# Patient Record
Sex: Female | Born: 1955 | Race: White | Hispanic: No | Marital: Married | Smoking: Never smoker
Health system: Southern US, Community
[De-identification: ages and names within clinical notes are randomized; demographics above are authoritative.]

## PROBLEM LIST (undated history)

## (undated) DIAGNOSIS — I10 Essential (primary) hypertension: Secondary | ICD-10-CM

## (undated) DIAGNOSIS — I77810 Thoracic aortic ectasia: Secondary | ICD-10-CM

## (undated) DIAGNOSIS — C801 Malignant (primary) neoplasm, unspecified: Secondary | ICD-10-CM

## (undated) HISTORY — PX: CARDIAC CATHETERIZATION: SHX172

## (undated) HISTORY — DX: Essential (primary) hypertension: I10

## (undated) HISTORY — DX: Thoracic aortic ectasia: I77.810

---

## 2000-10-23 HISTORY — PX: BREAST EXCISIONAL BIOPSY: SUR124

## 2006-06-07 ENCOUNTER — Ambulatory Visit: Payer: Self-pay | Admitting: Cardiovascular Disease

## 2010-06-30 ENCOUNTER — Encounter (INDEPENDENT_AMBULATORY_CARE_PROVIDER_SITE_OTHER): Payer: Self-pay | Admitting: *Deleted

## 2010-11-22 NOTE — Letter (Signed)
Summary: Colonoscopy Letter  [facility]  [address] [place], [place] [address]   Phone: [phone]  Fax: [phone]      June 30, 2010 MRN: [id number]   [facility] [address] [place], [place]  [address]   Dear [name],   According to your medical record, it is time for you to schedule a Colonoscopy. The American Cancer Society recommends this procedure as a method to detect early colon cancer. Patients with a family history of colon cancer, or a personal history of colon polyps or inflammatory bowel disease are at increased risk.  This letter has beeen generated based on the recommendations made at the time of your procedure. If you feel that in your particular situation this may no longer apply, please contact our office.  Please call our office at [phone] to schedule this appointment or to update your records at your earliest convenience.  Thank you for cooperating with [place] to provide you with the very best care possible.   Sincerely,  Hedwig Morton. Juanda Chance, M.D.  [facility] Gastroenterology Division [phone]

## 2011-09-08 ENCOUNTER — Ambulatory Visit: Payer: Self-pay | Admitting: Family Medicine

## 2012-06-26 ENCOUNTER — Encounter: Payer: Self-pay | Admitting: Internal Medicine

## 2012-09-10 ENCOUNTER — Ambulatory Visit: Payer: Self-pay | Admitting: Family Medicine

## 2013-05-29 ENCOUNTER — Ambulatory Visit: Payer: Self-pay | Admitting: Family Medicine

## 2013-10-27 ENCOUNTER — Ambulatory Visit: Payer: Self-pay

## 2016-01-17 ENCOUNTER — Ambulatory Visit: Payer: Self-pay

## 2016-02-02 ENCOUNTER — Ambulatory Visit: Payer: Self-pay | Attending: Oncology | Admitting: *Deleted

## 2016-02-02 ENCOUNTER — Ambulatory Visit
Admission: RE | Admit: 2016-02-02 | Discharge: 2016-02-02 | Disposition: A | Discharge status: Home / Self Care | Payer: Self-pay | Source: Ambulatory Visit | Attending: Oncology | Admitting: Oncology

## 2016-02-02 ENCOUNTER — Encounter: Payer: Self-pay | Admitting: *Deleted

## 2016-02-02 VITALS — BP 152/86 | HR 106 | Temp 97.8°F | Resp 12 | Ht <= 58 in | Wt 263.0 lb

## 2016-02-02 DIAGNOSIS — Z0000 Encounter for general adult medical examination without abnormal findings: Secondary | ICD-10-CM

## 2016-02-02 NOTE — Progress Notes (Signed)
Subjective:     Patient ID: Julie Church, female   DOB: September 11, 1956, 60 y.o.   MRN: [id number]  HPI   Review of Systems     Objective:   Physical Exam  Pulmonary/Chest: Right breast exhibits no inverted nipple, no mass, no nipple discharge, no skin change and no tenderness. Left breast exhibits no mass, no nipple discharge, no skin change and no tenderness. Breasts are asymmetrical.    Right breast larger than the left breast  Abdominal: There is no splenomegaly or hepatomegaly.  Genitourinary: No labial fusion. There is no rash, tenderness, lesion or injury on the right labia. There is no rash, tenderness, lesion or injury on the left labia. Cervix exhibits friability. Cervix exhibits no discharge. No erythema or tenderness in the vagina. No signs of injury around the vagina. No vaginal discharge found.    Patient with supracervical hysterectomy.  States she does not have her ovaries.  Cervix bled slightly on exam.       Assessment:     60 year old White female presents to [facility] for clinical breast exam, possible pap and mammogram.  Clinical breast exam unremarkable.  Taught self breast awareness.  Pelvic exam reveals a supracervical hysterectomy.  Patient was unsure if she had her cervix.  Explained that she would need to follow pap smear screening quidelines.  Specimen collected for pap smear.  Patient has been screened for eligibility.  She does not have any insurance, Medicare or Medicaid.  She also meets financial eligibility.  Hand-out given on the Affordable Care Act.     Plan:     Screening mammogram ordered.  Specimen sent to the lab.  Will follow-up per BCCCP protocol.

## 2016-02-02 NOTE — Patient Instructions (Addendum)
Human Papillomavirus Human papillomavirus (HPV) is the most common sexually transmitted infection (STI) and is highly contagious. HPV infections cause genital warts and cancers to the outlet of the womb (cervix), birth canal (vagina), opening of the birth canal (vulva), and anus. There are over 100 types of HPV. Unless wartlike lesions are present in the throat or there are genital warts that you can see or feel, HPV usually does not cause symptoms. It is possible to be infected for long periods and pass it on to others without knowing it. CAUSES  HPV is spread from person to person through sexual contact. This includes oral, vaginal, or anal sex. RISK FACTORS  Having unprotected sex. HPV can be spread by oral, vaginal, or anal sex.  Having several sex partners.  Having a sex partner who has other sex partners.  Having or having had another sexually transmitted infection. SIGNS AND SYMPTOMS  Most people carrying HPV do not have any symptoms. If symptoms are present, symptoms may include:  Wartlike lesions in the throat (from having oral sex).  Warts in the infected skin or mucous membranes.  Genital warts that may itch, burn, or bleed.  Genital warts that may be painful or bleed during sexual intercourse. DIAGNOSIS  If wartlike lesions are present in the throat or genital warts are present, your health care provider can usually diagnose HPV by physical examination.   Genital warts are easily seen with the naked eye.  Currently, there is no FDA-approved test to detect HPV in males.  In females, a Pap test can show cells that are infected with HPV.  In females, a scope can be used to view the cervix (colposcopy). A colposcopy can be performed if the pelvic exam or Pap test is abnormal. A sample of tissue may be removed (biopsy) during the colposcopy. TREATMENT  There is no treatment for the virus itself. However, there are treatments for the health problems and symptoms HPV can cause.  Your health care provider will follow you closely after you are treated. This is because the HPV can come back and may need treatment again. Treatment of HPV may include:   Medicines, which may be injected or applied in a cream, lotion, or gel form.  Use of a probe to apply extreme cold (cryotherapy).  Application of an intense beam of light (laser treatment).  Use of a probe to apply extreme heat (electrocautery).  Surgery. HOME CARE INSTRUCTIONS   Take medicines only as directed by your health care provider.  Use over-the-counter creams for itching or irritation as directed by your health care provider.  Keep all follow-up visits as directed by your health care provider. This is important.  Do not touch or scratch the warts.  Do not treat genital warts with medicines used for treating hand warts.  Do not have sex while you are being treated.  Do not douche or use tampons during treatment of HPV.  Tell your sex partner about your infection because he or she may also need treatment.  If you become pregnant, tell your health care provider that you have had HPV. Your health care provider will monitor you closely during pregnancy to be sure your baby is safe.  After treatment, use condoms during sex to prevent future infections.  Have only one sex partner.  Have a sex partner who does not have other sex partners. PREVENTION   Talk to your health care provider about getting the HPV vaccines. These vaccines prevent some HPV infections and cancers.  It is recommended that the vaccine be given to males and females between the ages of 9 and 26 years old. It will not work if you already have HPV, and it is not recommended for pregnant women.  A Pap test is done to screen for cervical cancer in women.  The first Pap test should be done at age 21 years.  Between ages 21 and 29 years, Pap tests are repeated every 2 years.  Beginning at age 30, you are advised to have a Pap test every  3 years as long as your past 3 Pap tests have been normal.  Some women have medical problems that increase the chance of getting cervical cancer. Talk to your health care provider about these problems. It is especially important to talk to your health care provider if a new problem develops soon after your last Pap test. In these cases, your health care provider may recommend more frequent screening and Pap tests.  The above recommendations are the same for women who have or have not gotten the vaccine for HPV.  If you had a hysterectomy for a problem that was not a cancer or a condition that could lead to cancer, then you no longer need Pap tests. However, even if you no longer need a Pap test, a regular exam is a good idea to make sure no other problems are starting.   If you are between the ages of 65 and 70 years and you have had normal Pap tests going back 10 years, you no longer need Pap tests. However, even if you no longer need a Pap test, a regular exam is a good idea to make sure no other problems are starting.  If you have had past treatment for cervical cancer or a condition that could lead to cancer, you need Pap tests and screening for cancer for at least 20 years after your treatment.  If Pap tests have been discontinued, risk factors (such as a new sexual partner)need to be reassessed to determine if screening should be resumed.  Some women may need screenings more often if they are at high risk for cervical cancer. SEEK MEDICAL CARE IF:   The treated skin becomes red, swollen, or painful.  You have a fever.  You feel generally ill.  You feel lumps or pimple-like projections in and around your genital area.  You develop bleeding of the vagina or the treatment area.  You have painful sexual intercourse. MAKE SURE YOU:   Understand these instructions.  Will watch your condition.  Will get help if you are not doing well or get worse.   This information is not  intended to replace advice given to you by your health care provider. Make sure you discuss any questions you have with your health care provider.   Document Released: 12/30/2003 Document Revised: 10/30/2014 Document Reviewed: 01/14/2014 Elsevier Interactive Patient Education 2016 Elsevier Inc.   Gave patient hand-out, Women Staying Healthy, Active and Well from BCCCP, with education on breast health, pap smears, heart and colon health. 

## 2016-02-05 LAB — PAP LB AND HPV HIGH-RISK
HPV, HIGH-RISK: NEGATIVE
PAP Smear Comment: 0

## 2016-02-08 ENCOUNTER — Encounter: Payer: Self-pay | Admitting: *Deleted

## 2016-02-08 NOTE — Progress Notes (Signed)
Mailed letter to inform patient of her normal mammogram and pap results.  Next mammo in one year and next pap in 5 years.  [facility] to [place].

## 2017-03-26 ENCOUNTER — Ambulatory Visit
Admission: RE | Admit: 2017-03-26 | Discharge: 2017-03-26 | Disposition: A | Discharge status: Home / Self Care | Payer: Self-pay | Source: Ambulatory Visit | Attending: Oncology | Admitting: Oncology

## 2017-03-26 ENCOUNTER — Encounter: Payer: Self-pay | Admitting: *Deleted

## 2017-03-26 ENCOUNTER — Ambulatory Visit: Payer: Self-pay | Attending: Oncology | Admitting: *Deleted

## 2017-03-26 VITALS — BP 127/72 | HR 70 | Temp 98.2°F | Ht 65.0 in | Wt 232.0 lb

## 2017-03-26 DIAGNOSIS — Z0000 Encounter for general adult medical examination without abnormal findings: Secondary | ICD-10-CM

## 2017-03-26 HISTORY — DX: Malignant (primary) neoplasm, unspecified: C80.1

## 2017-03-26 NOTE — Progress Notes (Signed)
Subjective:     Patient ID: Julie Church, female   DOB: 1956-08-10, 61 y.o.   MRN: [id number]  HPI   Review of Systems     Objective:   Physical Exam  Pulmonary/Chest: Right breast exhibits no inverted nipple, no mass, no nipple discharge, no skin change and no tenderness. Left breast exhibits no inverted nipple, no mass, no nipple discharge, no skin change and no tenderness. Breasts are symmetrical.         Assessment:     61 year old White female returns to [facility] for annual screening.  Clinical breast exam unremarkable.  Taught self breast exam.  Patient with history of supracervical hysterectomy.  Last pap 2017 was negative / negative.  Informed that next pap will be due in 2022.  Patient has been screened for eligibility.  She does not have any insurance, Medicare or Medicaid.  She also meets financial eligibility.  Hand-out given on the Affordable Care Act.    Plan:     Screening mammogram ordered.  Will follow per BCCCP protocol.

## 2017-03-26 NOTE — Patient Instructions (Signed)
Gave patient hand-out, Women Staying Healthy, Active and Well from BCCCP, with education on breast health, pap smears, heart and colon health. 

## 2018-09-04 ENCOUNTER — Ambulatory Visit
Admission: RE | Admit: 2018-09-04 | Discharge: 2018-09-04 | Disposition: A | Discharge status: Home / Self Care | Payer: Self-pay | Source: Ambulatory Visit | Attending: Oncology | Admitting: Oncology

## 2018-09-04 ENCOUNTER — Ambulatory Visit: Payer: Self-pay | Attending: Oncology

## 2018-09-04 VITALS — BP 145/89 | HR 78 | Temp 97.3°F | Ht 66.0 in | Wt 217.0 lb

## 2018-09-04 DIAGNOSIS — Z0000 Encounter for general adult medical examination without abnormal findings: Secondary | ICD-10-CM

## 2018-09-04 NOTE — Progress Notes (Addendum)
  Subjective:     Patient ID: Julie Church, female   DOB: 28-Feb-1956, 62 y.o.   MRN: [id number]  HPI   Review of Systems     Objective:   Physical Exam  Pulmonary/Chest: Right breast exhibits no inverted nipple, no mass, no nipple discharge, no skin change and no tenderness. Left breast exhibits no inverted nipple, no mass, no nipple discharge, no skin change and no tenderness. Breasts are symmetrical.       Assessment:     62 year old patient returns for annual [facility] visit. Patient reports 61 lb  Intentional weight loss. Patient screened, and meets BCCCP eligibility.  Patient does not have insurance, Medicare or Medicaid.  Handout given on Affordable Care Act. Instructed patient on breast self awareness using teach back method.  Clinical breast exam unremarkable.  No mass or lump palpated.   Risk Assessment    Risk Scores      09/04/2018   Last edited by: Rico Junker, RN   5-year risk: 1.4 %   Lifetime risk: 6.5 %             Plan:     Sent for bilateral screening mammogram.

## 2018-09-10 NOTE — Progress Notes (Signed)
Letter mailed from [facility] to notify of normal mammogram results.  Patient to return in one year for annual screening.  Copy to [facility]. 

## 2019-11-06 ENCOUNTER — Ambulatory Visit: Payer: HRSA Program | Attending: Internal Medicine

## 2019-11-06 DIAGNOSIS — Z20822 Contact with and (suspected) exposure to covid-19: Secondary | ICD-10-CM | POA: Insufficient documentation

## 2019-11-07 LAB — NOVEL CORONAVIRUS, NAA: SARS-CoV-2, NAA: NOT DETECTED

## 2019-12-16 ENCOUNTER — Ambulatory Visit: Payer: Self-pay | Admitting: *Deleted

## 2019-12-16 NOTE — Telephone Encounter (Signed)
  Answer Assessment - Initial Assessment Questions 1. MAIN CONCERN OR SYMPTOM:  "What is your main concern right now?" "What question do you have?" "What's the main symptom you're worried about?" (e.g., fever, pain, redness, swelling)     Fever of 101.7 2. VACCINE: "What vaccination did you receive?" "Is this your first or second shot?" (e.g., none; [place], other)     End dose of Pfizer 3. SYMPTOM ONSET: "When did the fever begin?" (e.g., not relevant; hours, days)      Today prior to callng 4. SYMPTOM SEVERITY: "How bad is it?"     Temperature 101.7  5. FEVER: "Is there a fever?" If so, ask: "What is it, how was it measured, and when did it start?"      yes 6. PAST REACTIONS: "Have you reacted to immunizations before?" If so, ask: "What happened?"     Not assessed 7. OTHER SYMPTOMS: "Do you have any other symptoms?"     Chills beginning on yesterday and also today  Protocols used: CORONAVIRUS (COVID-19) VACCINE QUESTIONS AND REACTIONS-A-AH

## 2019-12-16 NOTE — Telephone Encounter (Signed)
Patient calling after receiving 2nd Covid-19 on yesterday. Pt states she has a fever of 101.7 currently. Pt states she has not taken any medication to treat the fever currently. Pt calling to see when she should become concerned regarding the fever. Explained to patient that a fever is a common reaction to the vaccine. Pt given home care advice to treat fever and advised to contact PCP if fever last longer than 48 hours or if she becomes worse. Pt verbalized understanding.     Reason for Disposition . COVID-19 vaccine, systemic reactions (e.g., fatigue, fever, muscle aches), questions about  Protocols used: CORONAVIRUS (COVID-19) VACCINE QUESTIONS AND REACTIONS-A-AH

## 2021-06-29 ENCOUNTER — Ambulatory Visit: Payer: Self-pay | Attending: Oncology | Admitting: *Deleted

## 2021-06-29 ENCOUNTER — Ambulatory Visit
Admission: RE | Admit: 2021-06-29 | Discharge: 2021-06-29 | Disposition: A | Discharge status: Home / Self Care | Payer: Self-pay | Source: Ambulatory Visit | Attending: Oncology | Admitting: Oncology

## 2021-06-29 ENCOUNTER — Other Ambulatory Visit: Payer: Self-pay

## 2021-06-29 VITALS — BP 146/87 | HR 64 | Temp 98.9°F | Ht 65.0 in | Wt 243.0 lb

## 2021-06-29 DIAGNOSIS — Z0000 Encounter for general adult medical examination without abnormal findings: Secondary | ICD-10-CM

## 2021-06-29 NOTE — Patient Instructions (Signed)
Gave patient hand-out, Women Staying Healthy, Active and Well from BCCCP, with education on breast health, pap smears, heart and colon health. 

## 2021-06-29 NOTE — Progress Notes (Signed)
Subjective:     Patient ID: Julie Church, female   DOB: 1956-04-01, 65 y.o.   MRN: [id number]  HPI  [facility] Medical History Record - 06/28/21 1440       Breast History   Screening cycle New    Provider (CBE) Dr. Audie Pinto  [facility]    Initial Mammogram 06/29/21    Last Mammogram Annual    Last Mammogram Date 09/04/18    Provider (Mammogram)  [facility]    Recent Breast Symptoms None      Breast Cancer History   Breast Cancer History No personal or family history      Previous History of Breast Problems   Breast Surgery or Biopsy Left   benign lumpectomy   Breast Implants N/A    BSE Done Monthly      Gynecological/Obstetrical History   LMP --   65 yo   Is there any chance that the client could be pregnant?  No    Age at menarche 69    Age at menopause 34    PAP smear history Annually    Date of last PAP  02/02/16    Provider (PAP) [facility] neg/neg    Age at first live birth 83    Breast fed children Yes (type length in comments)   2 months   Cervical, Uterine or Ovarian cancer No    Family history of Cervial, Uterine or Ovarian cancer No    Hysterectomy Yes    Cervix removed No    Ovaries removed Yes    Laser/Cryosurgery No    Current method of birth control None    Current method of Estrogen/Hormone replacement None    Smoking history None              Review of Systems     Objective:   Physical Exam Chest:  Breasts:    Breasts are asymmetrical.     Right: No swelling, bleeding, inverted nipple, mass, nipple discharge, skin change or tenderness.     Left: No swelling, bleeding, inverted nipple, mass, nipple discharge, skin change or tenderness.       Comments: Right breast larger than the left Abdominal:     Palpations: There is no hepatomegaly or splenomegaly.  Genitourinary:    Exam position: Lithotomy position.     Labia:        Right: No rash, tenderness, lesion or injury.        Left: No rash, tenderness, lesion or injury.      Urethra: No  prolapse or urethral pain.     Vagina: No signs of injury and foreign body. No vaginal discharge, erythema, tenderness, bleeding, lesions or prolapsed vaginal walls.     Adnexa:        Right: No mass.         Left: No mass.       Comments: Supracervical hysterectomy Lymphadenopathy:     Upper Body:     Right upper body: No supraclavicular or axillary adenopathy.     Left upper body: No supraclavicular or axillary adenopathy.      Assessment:     65 year old female returns to [facility] for annual screening.  Clinical breast exam unremarkable.  Taught self breast awareness.  Patient with supracervical hysterectomy.  Specimen collected for pap smear.  Patient has been screened for eligibility.  She does not have any insurance, Medicare or Medicaid.  She also meets financial eligibility.   Risk Assessment  Risk Scores       06/29/2021 09/04/2018   Last edited by: Theodore Demark, RN Rico Junker, RN   5-year risk: 1.5 % 1.4 %   Lifetime risk: 6 % 6.5 %               Plan:     Screening mammogram ordered.  Specimen for pap sent to the lab.  Will follow up per BCCCP protocol.

## 2021-07-01 LAB — IGP, APTIMA HPV: HPV Aptima: NEGATIVE

## 2021-07-14 NOTE — Progress Notes (Signed)
Phoned patient with [facility] 1 mammogram results, and negative/negative   pap results.  Next pap due in 5 years. Patient  Instructed to return for annual screening. Copy to [facility].

## 2022-01-10 DIAGNOSIS — E7800 Pure hypercholesterolemia, unspecified: Secondary | ICD-10-CM | POA: Insufficient documentation

## 2022-01-27 ENCOUNTER — Encounter: Payer: Self-pay | Admitting: Cardiovascular Disease

## 2022-01-27 ENCOUNTER — Ambulatory Visit: Payer: Medicare Other | Admitting: Cardiovascular Disease

## 2022-01-27 DIAGNOSIS — I10 Essential (primary) hypertension: Secondary | ICD-10-CM | POA: Diagnosis not present

## 2022-01-27 DIAGNOSIS — R9431 Abnormal electrocardiogram [ECG] [EKG]: Secondary | ICD-10-CM

## 2022-01-27 DIAGNOSIS — E782 Mixed hyperlipidemia: Secondary | ICD-10-CM

## 2022-01-27 DIAGNOSIS — Z87891 Personal history of nicotine dependence: Secondary | ICD-10-CM

## 2022-01-27 NOTE — Progress Notes (Signed)
Cardiology Office Note ? ?Date:  01/27/2022  ? ?ID:  Julie Church, DOB 04/20/1956, MRN [id number] ? ?PCP:  Gae Bon, NP  ? ?Chief Complaint  ?Patient presents with  ? New Patient (Initial Visit)  ?  Patient was told at some point she had a mild heart attack. Patient had a Cardiac Cath 20 years ago at [facility]. Medications reviewed by the patient verbally.   ? ? ?HPI:  ?Julie Church is a 66 year old woman with past medical history of ?Former smoker, 10- 20 years ?Who presents for abnormal EKG, cardiac risk factors ? ?Recently seen by primary care, ?Reports that she was told by primary care that her EKG was abnormal, had prior MI ?No EKG available in our record system for review ?EKG and lab work has been requested ? ?Reports that she has no history of diabetes ?Current non-smoker ?On cholesterol medication ? 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 ?Has been on Lipitor at least 1 year  ? ?EKG personally reviewed by myself on todays visit ?Normal sinus rhythm rate 73 bpm no significant ST or T wave changes ? ?Other past medical history reviewed ?Reports having echocardiogram 2 weeks ago through primary care, this has been requested ?-Reports having cardiac catheterization 20 years ago, results unavailable ? ? ?PMH:   has a past medical history of Cancer ([place]). ? ?PSH:    ?Past Surgical History:  ?Procedure Laterality Date  ? BREAST EXCISIONAL BIOPSY Left 2002  ? benign  ? CARDIAC CATHETERIZATION    ? [facility]  ? ? ?Current Outpatient Medications  ?Medication Sig Dispense Refill  ? amLODipine (NORVASC) 5 MG tablet Take 5 mg by mouth daily.    ? atorvastatin (LIPITOR) 20 MG tablet Take 20 mg by mouth daily.    ? Coenzyme Q10 10 MG capsule Take 10 mg by mouth daily.    ? Glucosamine-Chondroitin 500-400 MG CAPS Take by mouth daily.    ?  lisinopril-hydrochlorothiazide (ZESTORETIC) 20-25 MG tablet Take 1 tablet by mouth daily.    ? Omega-3 Fatty Acids (FISH OIL) 1000 MG CAPS Take by mouth daily.    ? ALPRAZolam (XANAX) 0.25 MG tablet Use 1 hour before dental appointment (Patient not taking: Reported on 01/27/2022)    ? ?No current facility-administered medications for this visit.  ? ? ?Allergies:   Patient has no known allergies.  ? ?Social History:  The patient  reports that she has never smoked. She has never used smokeless tobacco.  ? ?Family History:   family history includes Hyperlipidemia in her father and mother; Hypertension in her father and mother.  ? ?Review of Systems: ?Review of Systems  ?Constitutional: Negative.   ?HENT: Negative.    ?Respiratory: Negative.    ?Cardiovascular: Negative.   ?Gastrointestinal: Negative.   ?Musculoskeletal: Negative.   ?Neurological: Negative.   ?Psychiatric/Behavioral: Negative.    ?All other systems reviewed and are negative. ? ? ?PHYSICAL EXAM: ?VS:  BP (!) 144/80 (BP Location: Right Arm, Patient Position: Sitting, Cuff Size: Large)   Pulse 73   Ht '5\' 4"'$  (1.626 m)   Wt 116.2 kg   SpO2 98%   BMI 43.99 kg/m?  , BMI Body mass index is 43.99 kg/m?. ?GEN: Well nourished, well developed, in no acute distress ?HEENT: normal ?Neck: no JVD, carotid bruits, or masses ?Cardiac: RRR;  no murmurs, rubs, or gallops,no edema  ?Respiratory:  clear to auscultation bilaterally, normal work of breathing ?GI: soft, nontender, nondistended, + BS ?MS: no deformity or atrophy ?Skin: warm and dry, no rash ?Neuro:  Strength and sensation are intact ?Psych: euthymic mood, full affect ? ?Recent Labs: ?No results found for requested labs within last 8760 hours.  ? ? ?Lipid Panel ?No results found for: CHOL, HDL, LDLCALC, TRIG ?  ? ?Wt Readings from Last 3 Encounters:  ?01/27/22 116.2 kg  ?06/29/21 110.2 kg  ?09/04/18 98.4 kg  ?  ? ?ASSESSMENT AND PLAN: ? ?Problem List Items Addressed This Visit   ?None ?Visit Diagnoses   ? ?  Morbid obesity ([place])    -  Primary  ? Relevant Orders  ? EKG 12-Lead  ? Abnormal EKG      ? Former smoker      ? Mixed hyperlipidemia      ? Relevant Medications  ? lisinopril-hydrochlorothiazide (ZESTORETIC) 20-25 MG tablet  ? atorvastatin (LIPITOR) 20 MG tablet  ? amLODipine (NORVASC) 5 MG tablet  ? ?  ? ?Evaluation of cardiac risk factors ?EKG is normal on today's visit ?We have requested outside records including EKG, lab work, echocardiogram done several weeks ago through primary care ?-Reports she is asymptomatic ?No further testing indicated at this time ? ?Hyperlipidemia ?She reports that she has very mild carotid disease on outside ultrasound ?Recommend she stay on her Lipitor ?Records requested ? ?Essential hypertension ?Blood pressure reasonable on today's visit, no changes to her medications ?Recommend she try to take her lisinopril HCTZ in the morning given nocturia ? ?Abnormal EKG ?Today's EKG normal explained that sometimes lead placement can lead to abnormal findings ?Outside records requested ? ? ? Total encounter time more than 50 minutes ? Greater than 50% was spent in counseling and coordination of care with the patient ? ? ? ?Signed, ?Esmond Plants, M.D., Ph.D. ?[facility] [place], [place] ?[phone] ?

## 2022-01-27 NOTE — Patient Instructions (Addendum)
Medication Instructions:  No changes  If you need a refill on your cardiac medications before your next appointment, please call your pharmacy.   Lab work: No new labs needed  Testing/Procedures: No new testing needed  Follow-Up: At [facility], you and your health needs are our priority.  As part of our continuing mission to provide you with exceptional heart care, we have created designated Provider Care Teams.  These Care Teams include your primary Cardiologist (physician) and Advanced Practice Providers (APPs -  Physician Assistants and Nurse Practitioners) who all work together to provide you with the care you need, when you need it.  You will need a follow up appointment as needed  Providers on your designated Care Team:   Christopher Berge, NP Ryan Dunn, PA-C Cadence Furth, PA-C  COVID-19 Vaccine Information can be found at: [email or web address] For questions related to vaccine distribution or appointments, please email [email or web address] or call [phone].    

## 2022-02-08 ENCOUNTER — Ambulatory Visit: Payer: Self-pay | Admitting: Cardiovascular Disease

## 2022-10-18 ENCOUNTER — Ambulatory Visit: Payer: Self-pay | Admitting: Family Medicine

## 2023-01-25 ENCOUNTER — Encounter: Payer: Self-pay | Admitting: Adult Health

## 2023-07-11 IMAGING — MG MM DIGITAL SCREENING BILAT W/ TOMO AND CAD
6 of 10 series · 6 of 30 positions shown · non-contrast
Comparison: Previous exam(s).

CLINICAL DATA: Screening.

EXAM:
DIGITAL SCREENING BILATERAL MAMMOGRAM WITH TOMOSYNTHESIS AND CAD
TECHNIQUE: Bilateral screening digital craniocaudal and mediolateral oblique
mammograms were obtained. Bilateral screening digital breast
tomosynthesis was performed. The images were evaluated with
computer-aided detection.

[R MLO synth-2D]
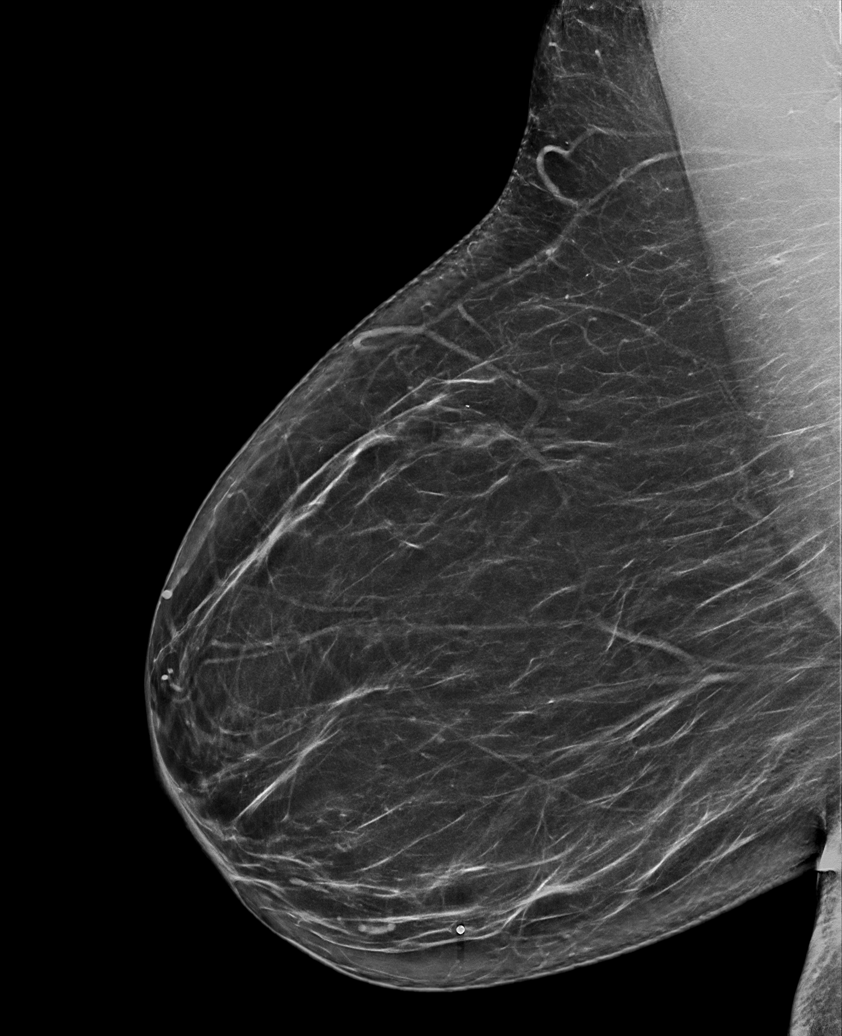

[L CC synth-2D (1 of 2)]
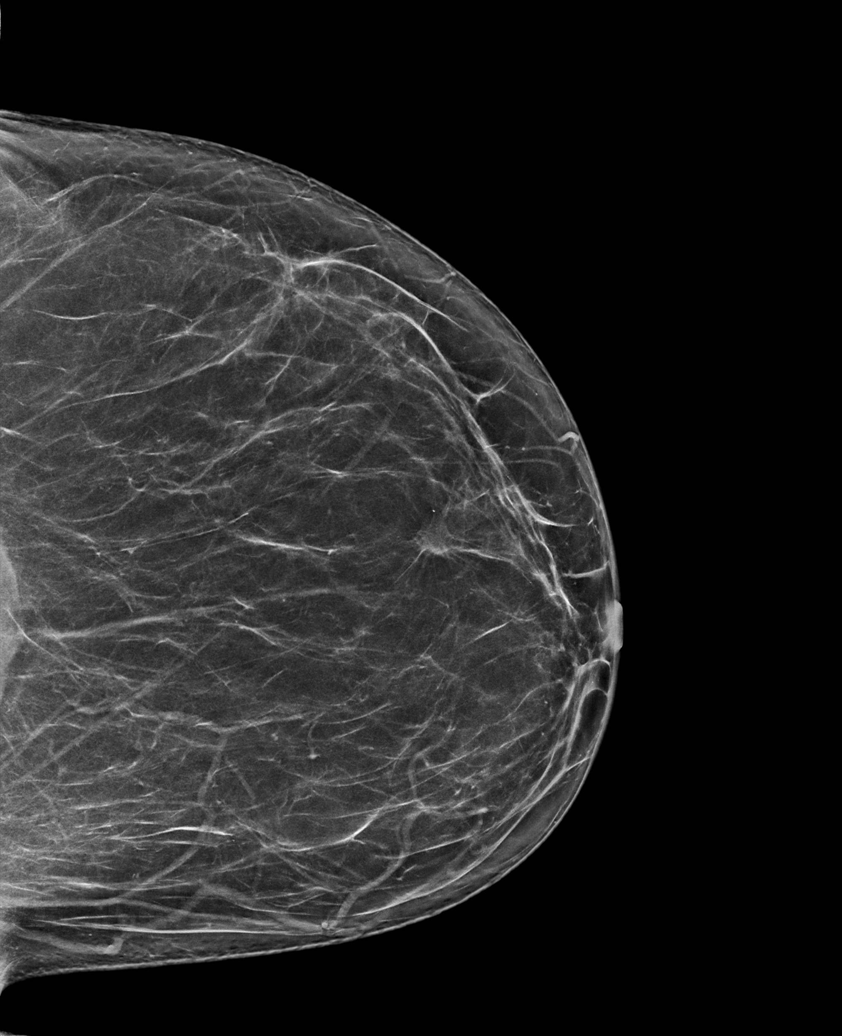

[R CC synth-2D]
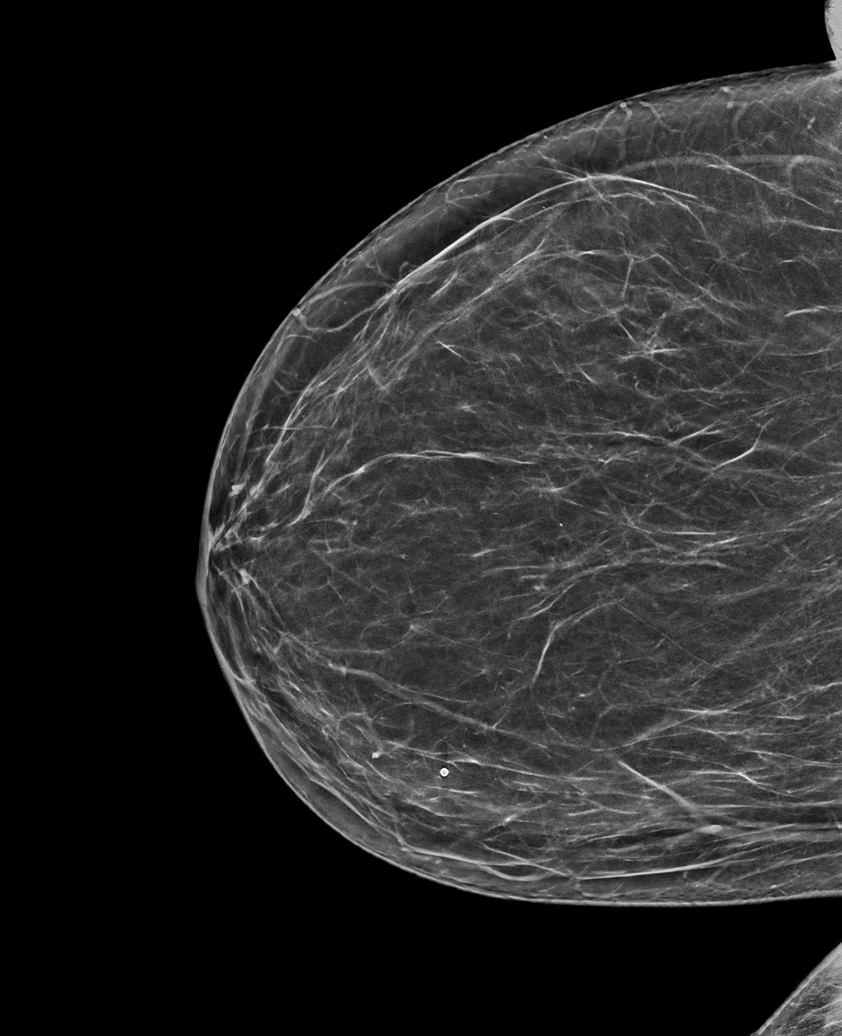

[L CC synth-2D (2 of 2)]
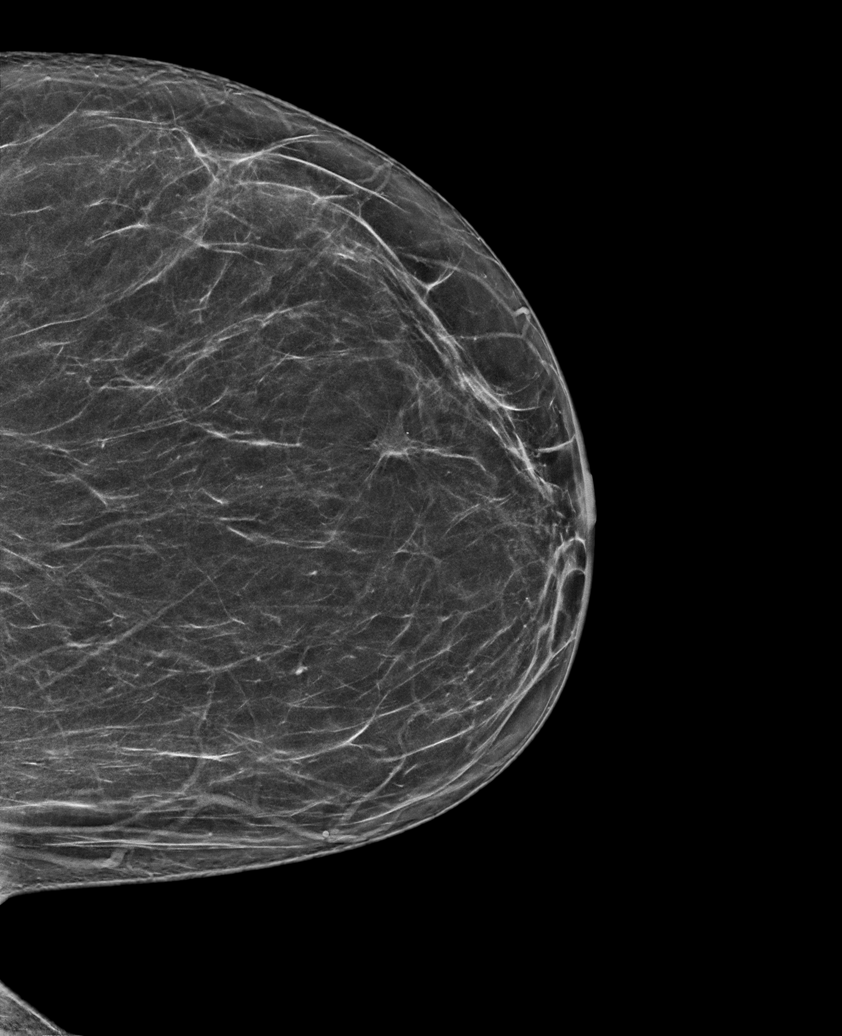

[L MLO synth-2D]
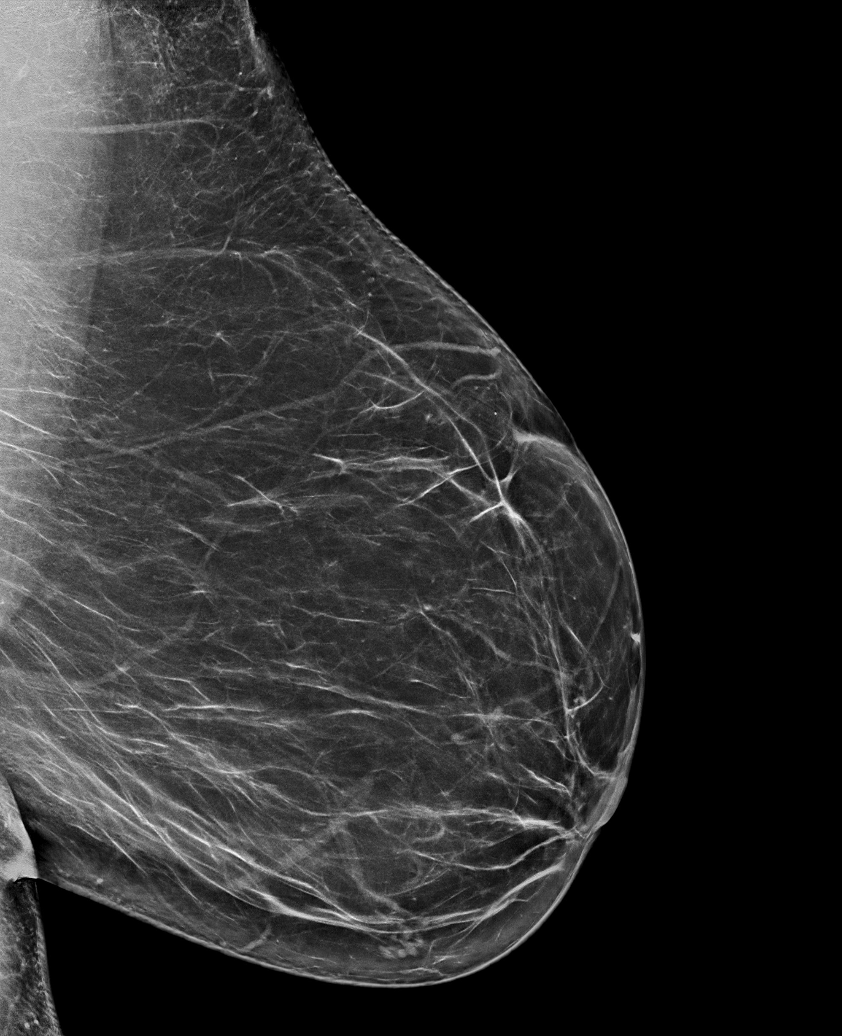

[R MLO tomo · tomo slice 41/80.0]
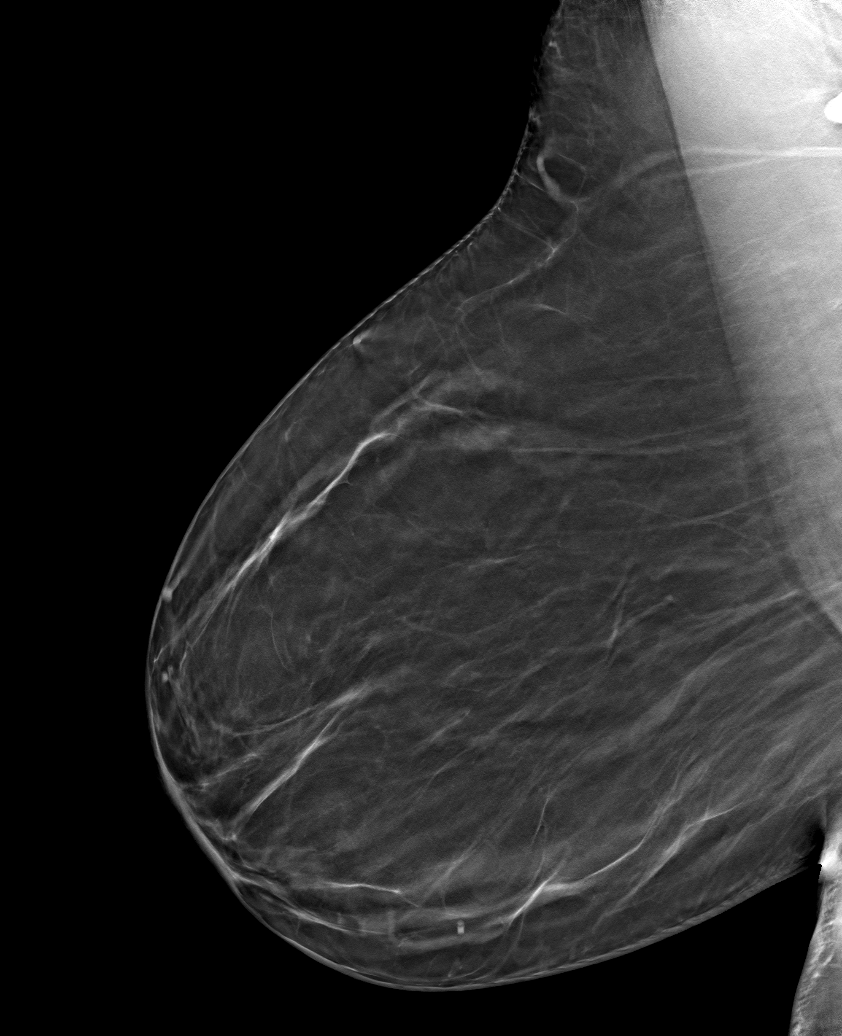

[6 of 30 positions shown; findings below may reference images not displayed]

ACR Breast Density Category b: There are scattered areas of
fibroglandular density.
FINDINGS: There are no findings suspicious for malignancy.
IMPRESSION: No mammographic evidence of malignancy. A result letter of this
screening mammogram will be mailed directly to the patient.

RECOMMENDATION:
Screening mammogram in one year. (Code:51-O-LD2)

BI-RADS CATEGORY  1: Negative.

## 2023-07-20 ENCOUNTER — Other Ambulatory Visit: Payer: Self-pay | Admitting: Adult Health

## 2023-07-20 DIAGNOSIS — Z1231 Encounter for screening mammogram for malignant neoplasm of breast: Secondary | ICD-10-CM

## 2023-08-01 ENCOUNTER — Ambulatory Visit
Admission: RE | Admit: 2023-08-01 | Discharge: 2023-08-01 | Disposition: A | Discharge status: Home / Self Care | Payer: Medicare HMO | Source: Ambulatory Visit | Attending: Adult Health | Admitting: Adult Health

## 2023-08-01 DIAGNOSIS — Z1231 Encounter for screening mammogram for malignant neoplasm of breast: Secondary | ICD-10-CM | POA: Insufficient documentation

## 2023-11-20 DIAGNOSIS — R7303 Prediabetes: Secondary | ICD-10-CM | POA: Insufficient documentation

## 2024-02-15 ENCOUNTER — Telehealth: Payer: Self-pay | Admitting: Cardiovascular Disease

## 2024-02-15 NOTE — Telephone Encounter (Signed)
 Called patient and left message for call back.

## 2024-02-15 NOTE — Telephone Encounter (Signed)
  Pt c/o swelling/edema: STAT if pt has developed SOB within 24 hours  If swelling, where is the swelling located? Both legs   How much weight have you gained and in what time span?   Have you gained 2 pounds in a day or 5 pounds in a week?   Do you have a log of your daily weights (if so, list)?   Are you currently taking a fluid pill? Yes   Are you currently SOB? No   Have you traveled recently in a car or plane for an extended period of time? No   Pt said, her pcp changed her medications and now her legs are swollen. She made an appt with Dr. Gollan, offered APP, however, she only wants to see Dr. Gollan

## 2024-02-15 NOTE — Telephone Encounter (Signed)
  Pt is returning call she said to call her at [phone]

## 2024-02-15 NOTE — Telephone Encounter (Signed)
 Called patient, advised that her PCP recently switched her BP medication over to Amlodipine 5 mg- she stated that she has had issues with the swelling since the start of the Amlodipine.   She only wanted to see Dr.Gollan requested to be placed on a wait list for cancellations.   Patient aware she should contact PCP since they placed her on the  medication, until she is able to get in with Dr.Gollan as it has been a few years since he seen her. Patient verbalized understanding and would call PCP as well.   Thank you!

## 2024-02-18 NOTE — Progress Notes (Unsigned)
 Cardiology Office Note    Date:  02/19/2024   ID:  Julie Church, DOB 01-13-1956, MRN [id number]  PCP:  Meri Stammer, NP  Cardiologist:  Belva Boyden, MD  Electrophysiologist:  None   Chief Complaint: Lower extremity swelling  History of Present Illness:   Julie Church is a 68 y.o. female with history of HTN, obesity, and depression who presents for evaluation of lower extremity swelling.  She was initially and last seen in the office on 01/27/2022 to establish care and for evaluation of abnormal EKG.  Echo performed through PCP's office in 01/2022 with EF reported at 61%, diastolic dysfunction, LVH, wall motion of the left ventricle, normal RV systolic function and ventricular cavity size, normal RVSP, no significant valvular abnormalities, and normal-sized aortic root.  She reported having undergone remote cardiac cath with results being unavailable for review.  EKG in our office showed sinus rhythm with no significant ST-T changes.  She was active at baseline without cardiac limitation.  No further cardiac testing was pursued at that time.  She contacted our office on 02/15/2024 reporting lower extremity swelling after amlodipine was titrated from 5 mg to 10 mg by outside office.  In this setting, appointment was made for today.  She comes in today reporting development of lower extremity swelling following the titration of amlodipine which was subsequently discontinued prior to yesterday.  In this setting she notes an improvement in lower extremity swelling at this time.  She indicates the swelling would be from the mid shin down and more progressive throughout the day.  She also notes some mild dyspnea when walking up from her chicken coop.  No frank chest pain.  No dizziness, presyncope, or syncope.  Her weight is down 16 pounds today when compared to her visit in 01/2022.  She indicates this is intentional and was a greater weight loss when she was doing weight watchers.   She does not monitor her sodium intake and at times will add salt to foods.  Not currently checking blood pressures at home.   Labs independently reviewed: 12/2023 - potassium 4.2, BUN 13, serum creatinine 0.6, albumin 4.7, AST/ALT normal, TC 162, TG 154, HDL 77, LDL 54, TSH normal 06/2023 - A1c 5.4 01/2018 - Hgb 14.4, PLT 368  Past Medical History:  Diagnosis Date   Cancer (HCC)    skin    Past Surgical History:  Procedure Laterality Date   BREAST EXCISIONAL BIOPSY Left 2002   benign   CARDIAC CATHETERIZATION     [facility]    Current Medications: Current Meds  Medication Sig   atorvastatin (LIPITOR) 20 MG tablet Take 20 mg by mouth daily.   Coenzyme Q10 10 MG capsule Take 10 mg by mouth daily.   Glucosamine-Chondroitin 500-400 MG CAPS Take by mouth daily.   lisinopril (ZESTRIL) 10 MG tablet Take 1 tablet by mouth daily.   lisinopril-hydrochlorothiazide (ZESTORETIC) 20-12.5 MG tablet Take 1 tablet by mouth daily.   Omega-3 Fatty Acids (FISH OIL) 1000 MG CAPS Take by mouth daily.    Allergies:   Patient has no known allergies.   Social History   Socioeconomic History   Marital status: Married    Spouse name: Not on file   Number of children: Not on file   Years of education: Not on file   Highest education level: Not on file  Occupational History   Not on file  Tobacco Use   Smoking status: Never   Smokeless tobacco: Never  Vaping Use   Vaping status: Never Used  Substance and Sexual Activity   Alcohol use: Not Currently   Drug use: Never   Sexual activity: Not on file  Other Topics Concern   Not on file  Social History Narrative   Not on file   Social Drivers of Health   Financial Resource Strain: Low Risk  (12/06/2023)   Received from [facility]   Overall Financial Resource Strain (CARDIA)    Difficulty of Paying Living Expenses: Not very hard  Food Insecurity: No Food Insecurity (12/06/2023)   Received from [facility]   Hunger Vital Sign     Worried About Running Out of Food in the Last Year: Never true    Ran Out of Food in the Last Year: Never true  Transportation Needs: No Transportation Needs (12/06/2023)   Received from [facility]   PRAPARE - Transportation    Lack of Transportation (Medical): No    Lack of Transportation (Non-Medical): No  Physical Activity: Not on file  Stress: Not on file  Social Connections: Unknown (04/29/2023)   Received from [facility]   Social Network    Social Network: Not on file     Family History:  The patient's family history includes Hyperlipidemia in her father and mother; Hypertension in her father and mother. There is no history of Breast cancer.  ROS:   12-point review of systems is negative unless otherwise noted in the HPI.   EKGs/Labs/Other Studies Reviewed:    Studies reviewed were summarized above. The additional studies were reviewed today: As above.  EKG:  EKG is ordered today.  The EKG ordered today demonstrates sinus bradycardia, 58 bpm, right axis deviation, no acute ST-T changes  Recent Labs: No results found for requested labs within last 365 days.  Recent Lipid Panel No results found for: "CHOL", "TRIG", "HDL", "CHOLHDL", "VLDL", "LDLCALC", "LDLDIRECT"  PHYSICAL EXAM:    VS:  BP 130/80 (BP Location: Left Arm, Patient Position: Sitting, Cuff Size: Large)   Pulse (!) 58   Ht 5\' 4"  (1.626 m)   Wt 240 lb 2 oz (108.9 kg)   SpO2 97%   BMI 41.22 kg/m   BMI: Body mass index is 41.22 kg/m.  Physical Exam Vitals reviewed.  Constitutional:      Appearance: She is well-developed.  HENT:     Head: Normocephalic and atraumatic.  Eyes:     General:        Right eye: No discharge.        Left eye: No discharge.  Cardiovascular:     Rate and Rhythm: Normal rate and regular rhythm.     Pulses:          Dorsalis pedis pulses are 2+ on the right side and 2+ on the left side.       Posterior tibial pulses are 2+ on the right side and 2+ on the left side.      Heart sounds: Normal heart sounds, S1 normal and S2 normal. Heart sounds not distant. No midsystolic click and no opening snap. No murmur heard.    No friction rub.  Pulmonary:     Effort: Pulmonary effort is normal. No respiratory distress.     Breath sounds: Normal breath sounds. No decreased breath sounds, wheezing, rhonchi or rales.  Chest:     Chest wall: No tenderness.  Musculoskeletal:     Cervical back: Normal range of motion.     Right lower leg: No  edema.     Left lower leg: No edema.  Skin:    General: Skin is warm and dry.     Nails: There is no clubbing.  Neurological:     Mental Status: She is alert and oriented to person, place, and time.  Psychiatric:        Speech: Speech normal.        Behavior: Behavior normal.        Thought Content: Thought content normal.        Judgment: Judgment normal.     Wt Readings from Last 3 Encounters:  02/19/24 240 lb 2 oz (108.9 kg)  01/27/22 256 lb 4 oz (116.2 kg)  06/29/21 243 lb (110.2 kg)     ASSESSMENT & PLAN:   Lower extremity swelling: Likely exacerbated and in the setting of amlodipine use.  Swelling was more progressive throughout the day indicative of dependent edema/venous insufficiency and less likely heart failure.  However, given some dyspnea we will obtain an echo to evaluate for new cardiomyopathy/structural changes.  Her weight is down 16 pounds today when compared to her initial/last visit in our office in 01/2022.  Further recommendations pending echo findings and trend off calcium channel blocker.  Dyspnea: Obtain echo.  If reassuring, and symptoms persist could consider noninvasive ischemic testing for further risk stratification.  HTN: Blood pressure is reasonably controlled in the office today.  Defer further titration of antihypertensive therapy given recent change from amlodipine and to titrated dose of lisinopril for the past 24 hours.  Currently on lisinopril 30 mg and HCTZ 12.5 mg daily.  Would avoid  calcium channel blocker usage moving forward.  Low-sodium diet is recommended.  HLD: LDL 54 in 12/2023.  She remains on atorvastatin 20 mg.  Followed by PCP.   Disposition: F/u with Dr. Gollan or an APP in 2 months.   Medication Adjustments/Labs and Tests Ordered: Current medicines are reviewed at length with the patient today.  Concerns regarding medicines are outlined above. Medication changes, Labs and Tests ordered today are summarized above and listed in the Patient Instructions accessible in Encounters.   Signed, Varney Gentleman, PA-C 02/19/2024 12:18 PM     [facility] - [place] [address] [place], [place] [address] [phone]

## 2024-02-19 ENCOUNTER — Ambulatory Visit: Attending: Physician Assistant | Admitting: Physician Assistant

## 2024-02-19 ENCOUNTER — Encounter: Payer: Self-pay | Admitting: Physician Assistant

## 2024-02-19 VITALS — BP 130/80 | HR 58 | Ht 64.0 in | Wt 240.1 lb

## 2024-02-19 DIAGNOSIS — R0602 Shortness of breath: Secondary | ICD-10-CM | POA: Diagnosis not present

## 2024-02-19 DIAGNOSIS — E782 Mixed hyperlipidemia: Secondary | ICD-10-CM

## 2024-02-19 DIAGNOSIS — I10 Essential (primary) hypertension: Secondary | ICD-10-CM

## 2024-02-19 DIAGNOSIS — M7989 Other specified soft tissue disorders: Secondary | ICD-10-CM

## 2024-02-19 NOTE — Patient Instructions (Signed)
 Medication Instructions:  Your Physician recommend you continue on your current medication as directed.    *If you need a refill on your cardiac medications before your next appointment, please call your pharmacy*  Lab Work: No labs ordered today  If you have labs (blood work) drawn today and your tests are completely normal, you will receive your results only by: MyChart Message (if you have MyChart) OR A paper copy in the mail If you have any lab test that is abnormal or we need to change your treatment, we will call you to review the results.  Testing/Procedures: Echo  Your physician has requested that you have an echocardiogram. Echocardiography is a painless test that uses sound waves to create images of your heart. It provides your doctor with information about the size and shape of your heart and how well your heart's chambers and valves are working.   You may receive an ultrasound enhancing agent through an IV if needed to better visualize your heart during the echo. This procedure takes approximately one hour.  There are no restrictions for this procedure.  This will take place at [address] [facility]) [address], [place] [address]  Please note: We ask at that you not bring children with you during ultrasound (echo/ vascular) testing. Due to room size and safety concerns, children are not allowed in the ultrasound rooms during exams. Our front office staff cannot provide observation of children in our lobby area while testing is being conducted. An adult accompanying a patient to their appointment will only be allowed in the ultrasound room at the discretion of the ultrasound technician under special circumstances. We apologize for any inconvenience.   Follow-Up: At [facility], you and your health needs are our priority.  As part of our continuing mission to provide you with exceptional heart care, our providers are all part of one team.  This team  includes your primary Cardiologist (physician) and Advanced Practice Providers or APPs (Physician Assistants and Nurse Practitioners) who all work together to provide you with the care you need, when you need it.  Your next appointment:   2 month(s)  Provider:   Timothy Gollan, MD or Varney Gentleman, PA-C

## 2024-04-03 ENCOUNTER — Ambulatory Visit: Attending: Physician Assistant

## 2024-04-03 DIAGNOSIS — R0602 Shortness of breath: Secondary | ICD-10-CM | POA: Diagnosis not present

## 2024-04-03 LAB — ECHOCARDIOGRAM COMPLETE
AR max vel: 4.01 cm2
AV Area VTI: 4.07 cm2
AV Area mean vel: 3.77 cm2
AV Mean grad: 4 mmHg
AV Peak grad: 7 mmHg
Ao pk vel: 1.32 m/s
Area-P 1/2: 3.77 cm2
S' Lateral: 2.93 cm

## 2024-04-04 ENCOUNTER — Other Ambulatory Visit: Payer: Self-pay

## 2024-04-04 ENCOUNTER — Ambulatory Visit: Payer: Self-pay | Admitting: Physician Assistant

## 2024-04-04 DIAGNOSIS — R0602 Shortness of breath: Secondary | ICD-10-CM

## 2024-04-20 NOTE — Progress Notes (Unsigned)
 Cardiology Office Note    Date:  04/23/2024   ID:  Julie Church, DOB 11-16-1955, MRN [id number]  PCP:  Sharl Delon NOVAK, NP  Cardiologist:  Evalene Lunger, MD  Electrophysiologist:  None   Chief Complaint: Follow-up  History of Present Illness:   Julie Church is a 67 y.o. female with history of HTN, dilated ascending thoracic aorta, obesity, and depression who presents for follow-up of echo.  She was initially seen in the office on 01/27/2022 to establish care and for evaluation of abnormal EKG.  Echo performed through PCP's office in 01/2022 with EF reported at 61%, diastolic dysfunction, LVH, normal RV systolic function and ventricular cavity size, normal RVSP, no significant valvular abnormalities, and normal-sized aortic root.  She reported having undergone remote cardiac cath with results being unavailable for review.  EKG in our office showed sinus rhythm with no significant ST-T changes.  She was active at baseline without cardiac limitation.  No further cardiac testing was pursued at that time.  She contacted our office on 02/15/2024 reporting lower extremity swelling after amlodipine was titrated from 5 mg to 10 mg by outside office.  She was subsequently seen in 01/2024 reporting lower extremity swelling following titration of amlodipine which was subsequently discontinued prior to appointment with improvement in lower extremity swelling.  She also noted some mild dyspnea when walking up from her chicken coop and intentional weight loss through lifestyle modification.  Echo in 03/2024 showed an EF of 60 to 65%, no regional wall motion abnormalities, normal LV diastolic function parameters, normal RV systolic function and ventricular cavity size, no significant valvular abnormalities, mild dilatation of the ascending aorta measuring 39 mm, and an estimated right atrial pressure of 3 mmHg.  She comes in doing well from a cardiac perspective.  She does not note any further shortness  of breath.  She does note some intermittent randomly occurring episodes of mild chest discomfort that will typically last a few seconds and spontaneously resolve.  She also notes her blood pressure is typically in the 140s to 150s systolic following discontinuation of amlodipine.  No further lower extremity swelling.  No dizziness, presyncope, or syncope.  She reports a family history in her cousin and aunt of brain aneurysm and is scheduled for CTA of the head later this month for this.   Labs independently reviewed: 12/2023 - potassium 4.2, BUN 13, serum creatinine 0.6, albumin 4.7, AST/ALT normal, TC 162, TG 154, HDL 77, LDL 54, TSH normal 06/2023 - A1c 5.4 01/2018 - Hgb 14.4, PLT 368  Past Medical History:  Diagnosis Date   Cancer (HCC)    skin   Dilatation of thoracic aorta (HCC)    Essential hypertension     Past Surgical History:  Procedure Laterality Date   BREAST EXCISIONAL BIOPSY Left 2002   benign   CARDIAC CATHETERIZATION     [facility]    Current Medications: Current Meds  Medication Sig   ALPRAZolam (XANAX) 0.25 MG tablet Use 1 hour before dental appointment   atorvastatin (LIPITOR) 20 MG tablet Take 20 mg by mouth daily.   Coenzyme Q10 10 MG capsule Take 10 mg by mouth daily.   CRANBERRY PO Take by mouth daily.   Glucosamine-Chondroitin 500-400 MG CAPS Take by mouth daily.   lisinopril-hydrochlorothiazide (ZESTORETIC) 20-12.5 MG tablet Take 1 tablet by mouth daily.   Multiple Vitamin (MULTIVITAMIN ADULT PO) daily.   Omega-3 Fatty Acids (FISH OIL) 1000 MG CAPS Take by mouth daily.   [DISCONTINUED]  lisinopril (ZESTRIL) 10 MG tablet Take 1 tablet by mouth daily.    Allergies:   Patient has no known allergies.   Social History   Socioeconomic History   Marital status: Married    Spouse name: Not on file   Number of children: Not on file   Years of education: Not on file   Highest education level: Not on file  Occupational History   Not on file  Tobacco Use    Smoking status: Never   Smokeless tobacco: Never  Vaping Use   Vaping status: Never Used  Substance and Sexual Activity   Alcohol use: Not Currently   Drug use: Never   Sexual activity: Not on file  Other Topics Concern   Not on file  Social History Narrative   Not on file   Social Drivers of Health   Financial Resource Strain: Low Risk  (12/06/2023)   Received from [facility]   Overall Financial Resource Strain (CARDIA)    Difficulty of Paying Living Expenses: Not very hard  Food Insecurity: No Food Insecurity (12/06/2023)   Received from [facility]   Hunger Vital Sign    Within the past 12 months, you worried that your food would run out before you got the money to buy more.: Never true    Within the past 12 months, the food you bought just didn't last and you didn't have money to get more.: Never true  Transportation Needs: No Transportation Needs (12/06/2023)   Received from [facility]   PRAPARE - Transportation    Lack of Transportation (Medical): No    Lack of Transportation (Non-Medical): No  Physical Activity: Not on file  Stress: Not on file  Social Connections: Unknown (04/29/2023)   Received from [facility]   Social Network    Social Network: Not on file     Family History:  The patient's family history includes Hyperlipidemia in her father and mother; Hypertension in her father and mother. There is no history of Breast cancer.  ROS:   12-point review of systems is negative unless otherwise noted in the HPI.   EKGs/Labs/Other Studies Reviewed:    Studies reviewed were summarized above. The additional studies were reviewed today:  2D echo 04/03/2024: 1. Left ventricular ejection fraction, by estimation, is 60 to 65%. The  left ventricle has normal function. The left ventricle has no regional  wall motion abnormalities. Left ventricular diastolic parameters were  normal.   2. Right ventricular systolic function is normal. The right ventricular   size is normal.   3. The mitral valve is normal in structure. No evidence of mitral valve  regurgitation.   4. The aortic valve is grossly normal. Aortic valve regurgitation is not  visualized.   5. Aortic dilatation noted. There is mild dilatation of the ascending  aorta, measuring 39 mm.   6. The inferior vena cava is normal in size with greater than 50%  respiratory variability, suggesting right atrial pressure of 3 mmHg.    EKG:  EKG is not ordered today.    Recent Labs: No results found for requested labs within last 365 days.  Recent Lipid Panel No results found for: CHOL, TRIG, HDL, CHOLHDL, VLDL, LDLCALC, LDLDIRECT  PHYSICAL EXAM:    VS:  BP (!) 148/90 (BP Location: Left Arm, Patient Position: Sitting, Cuff Size: Large)   Pulse 72   Ht 5' 4 (1.626 m)   Wt 235 lb (106.6 kg)   SpO2 98%  BMI 40.34 kg/m   BMI: Body mass index is 40.34 kg/m.  Physical Exam Vitals reviewed.  Constitutional:      Appearance: She is well-developed.  HENT:     Head: Normocephalic and atraumatic.  Eyes:     General:        Right eye: No discharge.        Left eye: No discharge.  Cardiovascular:     Rate and Rhythm: Normal rate and regular rhythm.     Pulses:          Posterior tibial pulses are 2+ on the right side and 2+ on the left side.     Heart sounds: Normal heart sounds, S1 normal and S2 normal. Heart sounds not distant. No midsystolic click and no opening snap. No murmur heard.    No friction rub.  Pulmonary:     Effort: Pulmonary effort is normal. No respiratory distress.     Breath sounds: Normal breath sounds. No decreased breath sounds, wheezing, rhonchi or rales.  Chest:     Chest wall: No tenderness.  Musculoskeletal:     Cervical back: Normal range of motion.     Right lower leg: No edema.     Left lower leg: No edema.  Skin:    General: Skin is warm and dry.     Nails: There is no clubbing.  Neurological:     Mental Status: She is alert and  oriented to person, place, and time.  Psychiatric:        Speech: Speech normal.        Behavior: Behavior normal.        Thought Content: Thought content normal.        Judgment: Judgment normal.     Wt Readings from Last 3 Encounters:  04/23/24 235 lb (106.6 kg)  02/19/24 240 lb 2 oz (108.9 kg)  01/27/22 256 lb 4 oz (116.2 kg)     ASSESSMENT & PLAN:   Precordial pain with exertional dyspnea: Echo without significant structural abnormality or cardiomyopathy.  Overall, symptoms of chest discomfort are atypical.  Schedule Lexiscan MPI to evaluate for high risk ischemia.  Dilated ascending thoracic aorta: Echo in 03/2024 with mildly dilated ascending thoracic aorta measuring 39 mm.  Further evaluate ascending thoracic aorta diameter on CT attenuation corrected imaging of stress test.  Pending findings, may need to pursue CTA chest/aorta given family history of brain aneurysms.  HTN: Blood pressure has been running high in the 140s to 150s systolic following discontinuation of amlodipine.  Titrate lisinopril to 40 mg daily with continuation of HCTZ 12.5 mg daily.  In follow-up, if blood pressure remains elevated consider addition of carvedilol.  HLD: LDL 54 in 12/2023.  She remains on atorvastatin 20 mg.     Disposition: F/u with Dr. Gollan or an APP in 2 months.   Medication Adjustments/Labs and Tests Ordered: Current medicines are reviewed at length with the patient today.  Concerns regarding medicines are outlined above. Medication changes, Labs and Tests ordered today are summarized above and listed in the Patient Instructions accessible in Encounters.   Signed, Bernardino Bring, PA-C 04/23/2024 12:45 PM     [facility] - [place] [employer] [address] [place], [place] [address] [phone]

## 2024-04-22 ENCOUNTER — Ambulatory Visit: Admitting: Cardiovascular Disease

## 2024-04-23 ENCOUNTER — Encounter: Payer: Self-pay | Admitting: Physician Assistant

## 2024-04-23 ENCOUNTER — Ambulatory Visit: Attending: Physician Assistant | Admitting: Physician Assistant

## 2024-04-23 VITALS — BP 148/90 | HR 72 | Ht 64.0 in | Wt 235.0 lb

## 2024-04-23 DIAGNOSIS — R072 Precordial pain: Secondary | ICD-10-CM | POA: Diagnosis not present

## 2024-04-23 DIAGNOSIS — E782 Mixed hyperlipidemia: Secondary | ICD-10-CM

## 2024-04-23 DIAGNOSIS — I10 Essential (primary) hypertension: Secondary | ICD-10-CM | POA: Diagnosis not present

## 2024-04-23 DIAGNOSIS — I77810 Thoracic aortic ectasia: Secondary | ICD-10-CM | POA: Diagnosis not present

## 2024-04-23 MED ORDER — LISINOPRIL 20 MG PO TABS: 20.0000 mg | ORAL_TABLET | Freq: Every day | ORAL | 3 refills | Status: DC

## 2024-04-23 NOTE — Patient Instructions (Signed)
 Medication Instructions:  Your physician recommends the following medication changes.  INCREASE: Lisinopril 20 mg daily  *If you need a refill on your cardiac medications before your next appointment, please call your pharmacy*  Lab Work: None ordered at this time  If you have labs (blood work) drawn today and your tests are completely normal, you will receive your results only by: MyChart Message (if you have MyChart) OR A paper copy in the mail If you have any lab test that is abnormal or we need to change your treatment, we will call you to review the results.  Testing/Procedures: Your provider has ordered a Lexiscan/ Exercise Myoview Stress test. This will take place at [facility]. Please report to the [facility] medical mall entrance. The volunteers at the first desk will direct you where to go.  [facility] MYOVIEW  Your provider has ordered a Stress Test with nuclear imaging. The purpose of this test is to evaluate the blood supply to your heart muscle. This procedure is referred to as a Non-Invasive Stress Test. This is because other than having an IV started in your vein, nothing is inserted or invades your body. Cardiac stress tests are done to find areas of poor blood flow to the heart by determining the extent of coronary artery disease (CAD). Some patients exercise on a treadmill, which naturally increases the blood flow to your heart, while others who are unable to walk on a treadmill due to physical limitations will have a pharmacologic/chemical stress agent called Lexiscan . This medicine will mimic walking on a treadmill by temporarily increasing your coronary blood flow.   Please note: these test may take anywhere between 2-4 hours to complete  How to prepare for your Myoview test:  Nothing to eat for 6 hours prior to the test No caffeine for 24 hours prior to test No smoking 24 hours prior to test. Your medication may be taken with water.  If your doctor stopped a medication because of  this test, do not take that medication. Ladies, please do not wear dresses.  Skirts or pants are appropriate. Please wear a short sleeve shirt. No perfume, cologne or lotion. Wear comfortable walking shoes. No heels!   PLEASE NOTIFY THE OFFICE AT LEAST 24 HOURS IN ADVANCE IF YOU ARE UNABLE TO KEEP YOUR APPOINTMENT.  [phone] AND  PLEASE NOTIFY NUCLEAR MEDICINE AT [facility] AT LEAST 24 HOURS IN ADVANCE IF YOU ARE UNABLE TO KEEP YOUR APPOINTMENT. [phone]   Follow-Up: At [facility], you and your health needs are our priority.  As part of our continuing mission to provide you with exceptional heart care, our providers are all part of one team.  This team includes your primary Cardiologist (physician) and Advanced Practice Providers or APPs (Physician Assistants and Nurse Practitioners) who all work together to provide you with the care you need, when you need it.  Your next appointment:   2 month(s)  Provider:   You may see Timothy Gollan, MD or Bernardino Bring, PA-C  We recommend signing up for the patient portal called MyChart.  Sign up information is provided on this After Visit Summary.  MyChart is used to connect with patients for Virtual Visits (Telemedicine).  Patients are able to view lab/test results, encounter notes, upcoming appointments, etc.  Non-urgent messages can be sent to your provider as well.   To learn more about what you can do with MyChart, go to [email or web address].

## 2024-05-12 ENCOUNTER — Encounter
Admission: RE | Admit: 2024-05-12 | Discharge: 2024-05-12 | Disposition: A | Discharge status: Home / Self Care | Source: Ambulatory Visit | Attending: Physician Assistant | Admitting: Physician Assistant

## 2024-05-12 ENCOUNTER — Other Ambulatory Visit: Payer: Self-pay | Admitting: Physician Assistant

## 2024-05-12 DIAGNOSIS — R072 Precordial pain: Secondary | ICD-10-CM | POA: Diagnosis not present

## 2024-05-12 MED ORDER — REGADENOSON 0.4 MG/5ML IV SOLN
0.4000 mg | Freq: Once | INTRAVENOUS | Status: AC
  Administered 2024-05-12: 0.4 mg via INTRAVENOUS

## 2024-05-12 MED ORDER — TECHNETIUM TC 99M TETROFOSMIN IV KIT
29.5800 | PACK | Freq: Once | INTRAVENOUS | Status: AC | PRN
  Administered 2024-05-12: 29.58 via INTRAVENOUS

## 2024-05-12 MED ORDER — TECHNETIUM TC 99M TETROFOSMIN IV KIT
10.7400 | PACK | Freq: Once | INTRAVENOUS | Status: AC | PRN
  Administered 2024-05-12: 10.74 via INTRAVENOUS

## 2024-05-12 NOTE — Progress Notes (Signed)
     Dickey PARAS Ladnier presented for a nuclear stress test today.  I Lesley LITTIE Maffucci, PA-C, provided direct supervision and was present during the stress portion of the study today, which was completed without significant symptoms, immediate complications, or acute ST/T changes on ECG.  Stress imaging is pending at this time.  Preliminary ECG findings may be listed in the chart, but the stress test result will not be finalized until perfusion imaging is complete.  Lesley LITTIE Maffucci, PA-C  05/12/2024, 11:13 AM

## 2024-05-13 ENCOUNTER — Ambulatory Visit: Payer: Self-pay | Admitting: Physician Assistant

## 2024-05-13 LAB — NM MYOCAR MULTI W/SPECT W/WALL MOTION / EF
LV dias vol: 82 mL (ref 46–106)
LV sys vol: 15 mL (ref 3.8–5.2)
MPHR: 153 {beats}/min
Nuc Stress EF: 82 %
Peak HR: 96 {beats}/min
Percent HR: 62 %
Rest HR: 63 {beats}/min
Rest Nuclear Isotope Dose: 10.7 mCi
SDS: 0
SRS: 0
SSS: 0
ST Depression (mm): 0 mm
Stress Nuclear Isotope Dose: 29.6 mCi
TID: 0.92

## 2024-06-29 NOTE — Progress Notes (Unsigned)
 Cardiology Office Note  Date:  06/30/2024   ID:  Devine Dant Spieker, DOB 09-16-1956, MRN [id number]  PCP:  Felicie Powell Chol, FNP   Chief Complaint  Patient presents with   Follow up Myoview      Doing well.     HPI:  [name] is a 68 year old woman with past medical history of Former smoker, 10- 20 years Who presents for abnormal EKG, cardiac risk factors, hypertension  LOV with myself 4/23 Seen by one of our providers July 2025  Previously taking amlodipine  5, doing well for several years, on 10 mg started developing leg swelling and amlodipine  was held with improvement of her swelling  For blood pressure lisinopril  dosing increased, additional 40 in the evening added with a lisinopril  HCTZ 20/12.5 in the morning  On today's visit blood pressure running 160/90  Active at baseline, regular exercise  Chronic Knee pain, was told she had bone-on-bone  Echo June 2025 EF 60%, normal RV size and function Aorta 3.9 cm  Myoview  July 2025 low risk study, likely normal  Labs reviewed Total cholesterol 162 LDL 54  Cardiac risk factors no history of diabetes Current non-smoker On cholesterol medication  Prior EKGs reviewed  Other past medical history reviewed Reports having echocardiogram 2 weeks ago through primary care, this has been requested -Reports having cardiac catheterization 20 years ago, results unavailable   PMH:   has a past medical history of Cancer [facility]), Dilatation of thoracic aorta (HCC), and Essential hypertension.  PSH:    Past Surgical History:  Procedure Laterality Date   BREAST EXCISIONAL BIOPSY Left 2002   benign   CARDIAC CATHETERIZATION     [facility]    Current Outpatient Medications  Medication Sig Dispense Refill   ALPRAZolam (XANAX) 0.25 MG tablet Use 1 hour before dental appointment     atorvastatin (LIPITOR) 20 MG tablet Take 20 mg by mouth daily.     Coenzyme Q10 10 MG capsule Take 10 mg by mouth daily.     CRANBERRY PO  Take by mouth daily.     Glucosamine-Chondroitin 500-400 MG CAPS Take by mouth daily.     lisinopril  (ZESTRIL ) 20 MG tablet Take 1 tablet (20 mg total) by mouth daily. 90 tablet 3   lisinopril -hydrochlorothiazide (ZESTORETIC) 20-12.5 MG tablet Take 1 tablet by mouth daily.     Multiple Vitamin (MULTIVITAMIN ADULT PO) daily.     Omega-3 Fatty Acids (FISH OIL) 1000 MG CAPS Take by mouth daily.     valACYclovir (VALTREX) 500 MG tablet Take 500 mg by mouth as needed.     No current facility-administered medications for this visit.    Allergies:   Patient has no known allergies.   Social History:  The patient  reports that she has never smoked. She has never used smokeless tobacco. She reports that she does not currently use alcohol. She reports that she does not use drugs.   Family History:   family history includes Hyperlipidemia in her father and mother; Hypertension in her father and mother.   Review of Systems: Review of Systems  Constitutional: Negative.   HENT: Negative.    Respiratory: Negative.    Cardiovascular: Negative.   Gastrointestinal: Negative.   Musculoskeletal: Negative.   Neurological: Negative.   Psychiatric/Behavioral: Negative.    All other systems reviewed and are negative.    PHYSICAL EXAM: VS:  BP (!) 160/84 (BP Location: Right Arm, Patient Position: Sitting, Cuff Size: Large)   Pulse 80   Ht 5' 4 (  1.626 m)   Wt 239 lb 6 oz (108.6 kg)   SpO2 98%   BMI 41.09 kg/m  , BMI Body mass index is 41.09 kg/m. Constitutional:  oriented to person, place, and time. No distress.  HENT:  Head: Grossly normal Eyes:  no discharge. No scleral icterus.  Neck: No JVD, no carotid bruits  Cardiovascular: Regular rate and rhythm, no murmurs appreciated Pulmonary/Chest: Clear to auscultation bilaterally, no wheezes or rales Abdominal: Soft.  no distension.  no tenderness.  Musculoskeletal: Normal range of motion Neurological:  normal muscle tone. Coordination normal. No  atrophy Skin: Skin warm and dry Psychiatric: normal affect, pleasant   Recent Labs: No results found for requested labs within last 365 days.    Lipid Panel No results found for: CHOL, HDL, LDLCALC, TRIG    Wt Readings from Last 3 Encounters:  06/30/24 239 lb 6 oz (108.6 kg)  04/23/24 235 lb (106.6 kg)  02/19/24 240 lb 2 oz (108.9 kg)     ASSESSMENT AND PLAN:  Problem List Items Addressed This Visit   None Visit Diagnoses       Precordial pain    -  Primary     Dilatation of thoracic aorta (HCC)         Primary hypertension         Mixed hyperlipidemia         SOB (shortness of breath)         Swelling of lower extremity           Hyperlipidemia mild carotid disease on outside ultrasound Continue Lipitor  Essential hypertension Children stress at home Blood pressure running high Recommend she restart amlodipine  5 mg daily Reports having leg swelling on 10 mg Stay on other meds lisinopril  and lisinopril  HCTZ Recommend she call us  with blood pressure measurements  Chest pain Echocardiogram with normal ejection fraction Low risk stress test No further testing at this time    Signed, Velinda Lunger, M.D., Ph.D. [facility] [place], [place] [phone]

## 2024-06-30 ENCOUNTER — Ambulatory Visit: Attending: Cardiovascular Disease | Admitting: Cardiovascular Disease

## 2024-06-30 ENCOUNTER — Encounter: Payer: Self-pay | Admitting: Cardiovascular Disease

## 2024-06-30 VITALS — BP 160/84 | HR 80 | Ht 64.0 in | Wt 239.4 lb

## 2024-06-30 DIAGNOSIS — I10 Essential (primary) hypertension: Secondary | ICD-10-CM | POA: Diagnosis not present

## 2024-06-30 DIAGNOSIS — R072 Precordial pain: Secondary | ICD-10-CM | POA: Diagnosis not present

## 2024-06-30 DIAGNOSIS — E782 Mixed hyperlipidemia: Secondary | ICD-10-CM | POA: Diagnosis not present

## 2024-06-30 DIAGNOSIS — I77810 Thoracic aortic ectasia: Secondary | ICD-10-CM | POA: Diagnosis not present

## 2024-06-30 DIAGNOSIS — M7989 Other specified soft tissue disorders: Secondary | ICD-10-CM

## 2024-06-30 DIAGNOSIS — R0602 Shortness of breath: Secondary | ICD-10-CM

## 2024-06-30 MED ORDER — AMLODIPINE BESYLATE 5 MG PO TABS: 5.0000 mg | ORAL_TABLET | Freq: Every day | ORAL | 3 refills | Status: DC

## 2024-06-30 NOTE — Patient Instructions (Addendum)
 Medication Instructions:   Please restart amlodipine  5 mg daily, stop for leg swelling  If you need a refill on your cardiac medications before your next appointment, please call your pharmacy.   Lab work: No new labs needed  Testing/Procedures: No new testing needed  Follow-Up: At [facility], you and your health needs are our priority.  As part of our continuing mission to provide you with exceptional heart care, we have created designated Provider Care Teams.  These Care Teams include your primary Cardiologist (physician) and Advanced Practice Providers (APPs -  Physician Assistants and Nurse Practitioners) who all work together to provide you with the care you need, when you need it.  You will need a follow up appointment in 6 months, APP ok  Providers on your designated Care Team:   Lonni Meager, NP Bernardino Bring, PA-C Cadence [place], [place]  COVID-19 Vaccine Information can be found at: PodExchange.nl For questions related to vaccine distribution or appointments, please email vaccine@[email or web address] .com or call [phone].

## 2024-07-11 ENCOUNTER — Encounter: Payer: Self-pay | Admitting: Cardiovascular Disease

## 2024-07-14 ENCOUNTER — Telehealth: Payer: Self-pay | Admitting: Cardiovascular Disease

## 2024-07-14 NOTE — Telephone Encounter (Signed)
 Called patient no answer, unable to leave message.

## 2024-07-14 NOTE — Telephone Encounter (Signed)
 Pt has been having a conversation on MyChart regarding blood pressure medication management. She is a bit confused about instructions and is requesting a callback for specific information. Please advise.

## 2024-07-15 MED ORDER — DOXAZOSIN MESYLATE 1 MG PO TABS: 1.0000 mg | ORAL_TABLET | Freq: Two times a day (BID) | ORAL | 3 refills | Status: DC

## 2024-07-15 NOTE — Telephone Encounter (Signed)
 Spoke to patient who stated that she is not comfortable taking 4 different blood pressure medications. Patient stated that she would like to just have one pill if possible and is asking that provider make recommendations.   Please advise.

## 2024-07-18 MED ORDER — DOXAZOSIN MESYLATE 2 MG PO TABS: 2.0000 mg | ORAL_TABLET | Freq: Two times a day (BID) | ORAL | 3 refills | Status: DC

## 2024-07-18 MED ORDER — LISINOPRIL-HYDROCHLOROTHIAZIDE 20-12.5 MG PO TABS: 2.0000 | ORAL_TABLET | Freq: Every day | ORAL | 3 refills | Status: DC

## 2024-07-18 NOTE — Telephone Encounter (Signed)
Called patient. No answer and unable to leave message

## 2024-07-18 NOTE — Addendum Note (Signed)
 Addended by: CRISTOPHER OLIVIA PARAS on: 07/18/2024 03:12 PM   Modules accepted: Orders

## 2024-07-21 ENCOUNTER — Telehealth: Payer: Self-pay | Admitting: Cardiovascular Disease

## 2024-07-21 ENCOUNTER — Encounter: Payer: Self-pay | Admitting: Cardiovascular Disease

## 2024-07-21 NOTE — Telephone Encounter (Signed)
 Pt having problem with getting bp stable sent reading via mychart please call to discuss. Pt is schedule for 10/24 with Dr. Gollan.

## 2024-07-21 NOTE — Telephone Encounter (Signed)
 Called patient. No answer and unable to leave message for call back.

## 2024-07-28 NOTE — Telephone Encounter (Signed)
 Called patient. No patient and unable to leave a message.

## 2024-07-29 NOTE — Telephone Encounter (Signed)
Called patient. No answer and unable to leave message

## 2024-08-15 ENCOUNTER — Ambulatory Visit: Admitting: Cardiovascular Disease

## 2024-09-05 ENCOUNTER — Other Ambulatory Visit: Payer: Self-pay | Admitting: Family Medicine

## 2024-09-05 DIAGNOSIS — Z1231 Encounter for screening mammogram for malignant neoplasm of breast: Secondary | ICD-10-CM

## 2024-10-01 ENCOUNTER — Telehealth: Payer: Self-pay

## 2024-10-01 ENCOUNTER — Other Ambulatory Visit (HOSPITAL_COMMUNITY): Payer: Self-pay

## 2024-10-01 ENCOUNTER — Ambulatory Visit (INDEPENDENT_AMBULATORY_CARE_PROVIDER_SITE_OTHER)

## 2024-10-01 DIAGNOSIS — Z0000 Encounter for general adult medical examination without abnormal findings: Secondary | ICD-10-CM | POA: Diagnosis not present

## 2024-10-01 DIAGNOSIS — I10 Essential (primary) hypertension: Secondary | ICD-10-CM | POA: Insufficient documentation

## 2024-10-01 DIAGNOSIS — I77810 Thoracic aortic ectasia: Secondary | ICD-10-CM | POA: Insufficient documentation

## 2024-10-01 DIAGNOSIS — Z90710 Acquired absence of both cervix and uterus: Secondary | ICD-10-CM | POA: Insufficient documentation

## 2024-10-01 DIAGNOSIS — E669 Obesity, unspecified: Secondary | ICD-10-CM | POA: Insufficient documentation

## 2024-10-01 DIAGNOSIS — Z87891 Personal history of nicotine dependence: Secondary | ICD-10-CM | POA: Insufficient documentation

## 2024-10-01 MED ORDER — TIRZEPATIDE-WEIGHT MANAGEMENT 2.5 MG/0.5ML ~~LOC~~ SOLN: 2.5000 mg | SUBCUTANEOUS | 2 refills | Status: DC

## 2024-10-01 MED ORDER — CHLORTHALIDONE 25 MG PO TABS: 25.0000 mg | ORAL_TABLET | Freq: Every day | ORAL | 1 refills | Status: AC

## 2024-10-01 MED ORDER — LISINOPRIL 40 MG PO TABS: 40.0000 mg | ORAL_TABLET | Freq: Every day | ORAL | 3 refills | Status: AC

## 2024-10-01 MED ORDER — ALPRAZOLAM 0.25 MG PO TABS: 0.2500 mg | ORAL_TABLET | ORAL | 0 refills | Status: AC | PRN

## 2024-10-01 NOTE — Telephone Encounter (Signed)
 Pharmacy Patient Advocate Encounter   Received notification from Onbase that prior authorization for Zepbound  2.5 is required/requested.   Insurance verification completed.   The patient is insured through [facility].   Per test claim: Per test claim, medication is not covered due to plan/benefit exclusion, PA not submitted at this time

## 2024-10-01 NOTE — Progress Notes (Signed)
 New Patient Visit   Physician: Etheridge Geil A Caylynn Minchew, MD  Patient: Julie Church   DOB: 1956/02/25   68 y.o. Female  MRN: [id number] Visit Date: 10/01/2024   Chief Complaint  Patient presents with   Establish Care   Subjective  Julie Church is a 68 y.o. female who presents today as a new patient to establish care.   HPI  Discussed the use of AI scribe software for clinical note transcription with the patient, who gave verbal consent to proceed.  History of Present Illness   Julie Church is a 68 year old female with hypertension who presents with upper respiratory symptoms and blood pressure management.  Upper respiratory symptoms - Congestion primarily in the head for three days - Productive cough with thick sputum - No fever - No shortness of breath - Using neti pot three times daily, ineffective this morning - Taking DayQuil  Hypertension management - Currently taking lisinopril  and hydrochlorothiazide  (20 mg/12.5 mg) - Blood pressure at home remains around 140 mmHg - Previously tried amlodipine , which caused leg swelling - No dizziness or lightheadedness  Obesity and weight management - High BMI - 42.6 - Taking a weight loss injection for three months without any weight loss - medication uncertain but was obtained online - Difficulty with physical activity due to knee pain - Reduced intake of sweets and fried foods - Primarily drinks water and coffee - History of total hysterectomy at age 39, perceived impact on metabolism  Sleep disturbance - Difficulty falling asleep - Feels rested once asleep  Mobility limitation - Decreased activity due to knee pain - Knee pain limits mobility         ASSESSMENT & PLAN  Encounter Diagnoses  Name Primary?   Hypertension, unspecified type Yes   Ascending aorta dilatation    Former smoker    General medical exam     Orders Placed This Encounter  Procedures   CBC with Differential/Platelet    Comprehensive metabolic panel with GFR   Hemoglobin A1c   Lipid panel   Urinalysis, Routine w reflex microscopic    Assessment and Plan    Acute upper respiratory infection Likely viral etiology due to recent travel and weather exposure. - Continue neti pot and DayQuil as needed. - Consider Flonase nasal spray post-neti pot. - Monitor for fever or worsening symptoms.  Essential hypertension Blood pressure remains elevated despite lisinopril  increase. Decision to switch to chlorthalidone for better control. - Discontinued current combination medication. - Prescribed lisinopril  separately and added chlorthalidone. - Instructed to keep a blood pressure log with varied readings. - Advised to report dizziness or lightheadedness.  Obesity BMI very high. Previous weight loss attempts unsuccessful. Discussed weight loss benefits for knee pain and health. - Attempted insurance coverage for Zepbound. - Consider alternative options for Zepbound if not covered. - Encouraged dietary modifications and explored knee-friendly exercises.  Bilateral primary osteoarthritis of knee Severe knee pain limiting mobility. Discussed weight loss benefits and potential knee replacement surgery. - Encouraged weight loss to alleviate knee pain. - Discussed potential future knee replacement surgery.  Thoracic aortic ectasia Mild aortic dilation noted - 39mm. Requires monitoring. - Monitor aortic dilation with annual echocardiograms.  Dental procedure-related anxiety Anxiety increases blood pressure during dental visits. Previous medication use for management.  General health maintenance Discussed importance of monitoring blood pressure and weight management. - Ordered baseline blood work. - Scheduled follow-up after Christmas to review labs and blood pressure management.  Objective  BP (!) 140/80   Pulse 86   Ht 5' 4 (1.626 m)   Wt 245 lb 9.6 oz (111.4 kg)   SpO2 93%   BMI 42.16 kg/m       Review of Systems  Constitutional:  Negative for chills, fever and weight loss.  Eyes:  Negative for blurred vision. h Respiratory:  Negative for cough and shortness of breath.   Cardiovascular:  Negative for chest pain and palpitations.  Skin:  Negative for rash.  Psychiatric/Behavioral:  Negative for depression. The patient is not nervous/anxious.      Physical Exam Physical Exam Vitals reviewed.  Constitutional:      Appearance: Normal appearance. Well-developed with normal weight.  HENT:     Head: Normocephalic and atraumatic.  Normal mucous membranes, no oral lesions, TM with fluid noted Eyes:     Pupils: Pupils are equal, round, and reactive to light.  Neck:     Thyroid: No thyroid mass or thyromegaly.  Cardiovascular:     Rate and Rhythm: Normal rate and regular rhythm. Normal heart sounds. Normal peripheral pulses Pulmonary:     Normal breath sounds with normal effort Abdominal:   Abdomen is soft, without tenderness or noted hepatosplenomegaly Musculoskeletal:        General: No swelling or edema  Lymphadenopathy:     Cervical: No cervical adenopathy.  Skin:    General: Skin is warm and dry without noticeable rash. Neurological:     General: No focal deficit present.  Psychiatric:        Mood and Affect: Mood, behavior and cognition normal   Past Medical History:  Diagnosis Date   Cancer (HCC)    skin   Dilatation of thoracic aorta    Essential hypertension    Past Surgical History:  Procedure Laterality Date   BREAST EXCISIONAL BIOPSY Left 2002   benign   CARDIAC CATHETERIZATION     [facility]   Family Status  Relation Name Status   Mother  Alive   Father  Alive   Neg Hx  (Not Specified)  No partnership data on file   Family History  Problem Relation Age of Onset   Hyperlipidemia Mother    Hypertension Mother    Hyperlipidemia Father    Hypertension Father    Breast cancer Neg Hx    Social History   Socioeconomic History   Marital  status: Married    Spouse name: Not on file   Number of children: Not on file   Years of education: Not on file   Highest education level: Not on file  Occupational History   Not on file  Tobacco Use   Smoking status: Never   Smokeless tobacco: Never  Vaping Use   Vaping status: Never Used  Substance and Sexual Activity   Alcohol use: Not Currently   Drug use: Never   Sexual activity: Not on file  Other Topics Concern   Not on file  Social History Narrative   Not on file   Social Drivers of Health   Financial Resource Strain: Low Risk (12/06/2023)   Received from [facility]   Overall Financial Resource Strain (CARDIA)    Difficulty of Paying Living Expenses: Not very hard  Food Insecurity: No Food Insecurity (12/06/2023)   [facility] from [facility]   Hunger Vital Sign    Within the past 12 months, you worried that your food would run out before you got the money to buy more.: Never true  Within the past 12 months, the food you bought just didn't last and you didn't have money to get more.: Never true  Transportation Needs: No Transportation Needs (12/06/2023)   [facility] - [facility]    Lack of Transportation (Medical): No    Lack of Transportation (Non-Medical): No  Physical Activity: Not on file  Stress: Not on file  Social Connections: Unknown (04/29/2023)   Received from [facility]   Social Network    Social Network: Not on file   Outpatient Medications Prior to Visit  Medication Sig   atorvastatin (LIPITOR) 20 MG tablet Take 20 mg by mouth daily.   CRANBERRY PO Take by mouth daily.   doxazosin  (CARDURA ) 2 MG tablet Take 1 tablet (2 mg total) by mouth 2 (two) times daily.   lisinopril -hydrochlorothiazide  (ZESTORETIC ) 20-12.5 MG tablet Take 2 tablets by mouth daily.   Multiple Vitamin (MULTIVITAMIN ADULT PO) daily.   Omega-3 Fatty Acids (FISH OIL) 1000 MG CAPS Take by mouth daily.   valACYclovir (VALTREX) 500 MG tablet  Take 500 mg by mouth as needed.   [DISCONTINUED] ALPRAZolam (XANAX) 0.25 MG tablet Use 1 hour before dental appointment (Patient not taking: Reported on 10/01/2024)   [DISCONTINUED] Coenzyme Q10 10 MG capsule Take 10 mg by mouth daily. (Patient not taking: Reported on 10/01/2024)   [DISCONTINUED] Glucosamine-Chondroitin 500-400 MG CAPS Take by mouth daily. (Patient not taking: Reported on 10/01/2024)   No facility-administered medications prior to visit.   No Known Allergies   There is no immunization history on file for this patient.  Health Maintenance  Topic Date Due   Medicare Annual Wellness (AWV)  Never done   Hepatitis C Screening  Never done   DTaP/Tdap/Td (1 - Tdap) Never done   Influenza Vaccine  05/23/2024   COVID-19 Vaccine (4 - 2025-26 season) 06/23/2024   Mammogram  07/31/2025   Colonoscopy  01/29/2034   Pneumococcal Vaccine: 50+ Years  Completed   Bone Density Scan  Completed   Zoster Vaccines- Shingrix  Completed   Meningococcal B Vaccine  Aged Out    Patient Care Team: Everlene Parris LABOR, MD as PCP - General (Family Medicine) Perla Evalene PARAS, MD as PCP - Cardiology (Cardiology)  Depression Screen     No data to display           Parris LABOR Everlene, MD  [facility] [phone] (phone) [phone] (fax)  [facility]

## 2024-10-02 LAB — COMPREHENSIVE METABOLIC PANEL WITH GFR
AG Ratio: 1.4 (calc) (ref 1.0–2.5)
ALT: 17 U/L (ref 6–29)
AST: 17 U/L (ref 10–35)
Albumin: 4.3 g/dL (ref 3.6–5.1)
Alkaline phosphatase (APISO): 87 U/L (ref 37–153)
BUN: 13 mg/dL (ref 7–25)
CO2: 25 mmol/L (ref 20–32)
Calcium: 9.8 mg/dL (ref 8.6–10.4)
Chloride: 101 mmol/L (ref 98–110)
Creat: 0.69 mg/dL (ref 0.50–1.05)
Globulin: 3 g/dL (ref 1.9–3.7)
Glucose, Bld: 100 mg/dL — ABNORMAL HIGH (ref 65–99)
Potassium: 4 mmol/L (ref 3.5–5.3)
Sodium: 138 mmol/L (ref 135–146)
Total Bilirubin: 0.7 mg/dL (ref 0.2–1.2)
Total Protein: 7.3 g/dL (ref 6.1–8.1)
eGFR: 95 mL/min/1.73m2 (ref >=60)

## 2024-10-02 LAB — CBC WITH DIFFERENTIAL/PLATELET
Absolute Lymphocytes: 3210 {cells}/uL (ref 850–3900)
Absolute Monocytes: 888 {cells}/uL (ref 200–950)
Basophils Absolute: 21 {cells}/uL (ref 0–200)
Basophils Relative: 0.2 %
Eosinophils Absolute: 128 {cells}/uL (ref 15–500)
Eosinophils Relative: 1.2 %
HCT: 44.5 % (ref 35.9–46.0)
Hemoglobin: 15 g/dL (ref 11.7–15.5)
MCH: 31.5 pg (ref 27.0–33.0)
MCHC: 33.7 g/dL (ref 31.6–35.4)
MCV: 93.5 fL (ref 81.4–101.7)
MPV: 9.8 fL (ref 7.5–12.5)
Monocytes Relative: 8.3 %
Neutro Abs: 6452 {cells}/uL (ref 1500–7800)
Neutrophils Relative %: 60.3 %
Platelets: 295 Thousand/uL (ref 140–400)
RBC: 4.76 Million/uL (ref 3.80–5.10)
RDW: 11.6 % (ref 11.0–15.0)
Total Lymphocyte: 30 %
WBC: 10.7 Thousand/uL (ref 3.8–10.8)

## 2024-10-02 LAB — URINALYSIS, ROUTINE W REFLEX MICROSCOPIC
Bacteria, UA: NONE SEEN /HPF
Bilirubin Urine: NEGATIVE
Glucose, UA: NEGATIVE
Hyaline Cast: NONE SEEN /LPF
Ketones, ur: NEGATIVE
Nitrite: NEGATIVE
Protein, ur: NEGATIVE
RBC / HPF: NONE SEEN /HPF (ref 0–2)
Specific Gravity, Urine: 1.006 (ref 1.001–1.035)
Squamous Epithelial / HPF: NONE SEEN /HPF (ref <=5)
pH: 7.5 (ref 5.0–8.0)

## 2024-10-02 LAB — LIPID PANEL
Cholesterol: 146 mg/dL (ref <200)
HDL: 63 mg/dL (ref >=50)
LDL Cholesterol (Calc): 60 mg/dL
Non-HDL Cholesterol (Calc): 83 mg/dL (ref <130)
Total CHOL/HDL Ratio: 2.3 (calc) (ref <5.0)
Triglycerides: 142 mg/dL (ref <150)

## 2024-10-02 LAB — HEMOGLOBIN A1C
Hgb A1c MFr Bld: 5.1 % (ref <5.7)
Mean Plasma Glucose: 100 mg/dL
eAG (mmol/L): 5.5 mmol/L

## 2024-10-02 LAB — MICROSCOPIC MESSAGE

## 2024-10-20 ENCOUNTER — Other Ambulatory Visit (HOSPITAL_COMMUNITY): Payer: Self-pay

## 2024-10-27 ENCOUNTER — Ambulatory Visit
Admission: RE | Admit: 2024-10-27 | Discharge: 2024-10-27 | Disposition: A | Discharge status: Home / Self Care | Source: Ambulatory Visit | Attending: Family Medicine | Admitting: Family Medicine

## 2024-10-27 DIAGNOSIS — Z1231 Encounter for screening mammogram for malignant neoplasm of breast: Secondary | ICD-10-CM | POA: Diagnosis present

## 2024-10-29 ENCOUNTER — Ambulatory Visit (INDEPENDENT_AMBULATORY_CARE_PROVIDER_SITE_OTHER)

## 2024-10-29 VITALS — BP 125/56 | HR 75 | Ht 64.0 in | Wt 241.8 lb

## 2024-10-29 DIAGNOSIS — N8110 Cystocele, unspecified: Secondary | ICD-10-CM | POA: Insufficient documentation

## 2024-10-29 DIAGNOSIS — I10 Essential (primary) hypertension: Secondary | ICD-10-CM | POA: Diagnosis not present

## 2024-10-29 DIAGNOSIS — Z90710 Acquired absence of both cervix and uterus: Secondary | ICD-10-CM | POA: Diagnosis not present

## 2024-10-29 DIAGNOSIS — E66813 Obesity, class 3: Secondary | ICD-10-CM | POA: Diagnosis not present

## 2024-10-29 DIAGNOSIS — N952 Postmenopausal atrophic vaginitis: Secondary | ICD-10-CM | POA: Diagnosis not present

## 2024-10-29 DIAGNOSIS — Z6841 Body Mass Index (BMI) 40.0 and over, adult: Secondary | ICD-10-CM | POA: Diagnosis not present

## 2024-10-29 DIAGNOSIS — M170 Bilateral primary osteoarthritis of knee: Secondary | ICD-10-CM | POA: Diagnosis not present

## 2024-10-29 MED ORDER — ESTRADIOL 10 MCG VA TABS: 1.0000 | ORAL_TABLET | Freq: Every day | VAGINAL | 1 refills | Status: DC

## 2024-10-29 NOTE — Progress Notes (Signed)
 "           Progress Note  Physician: Aunika Kirsten A Christropher Gintz, MD   HPI: Julie Church is a 69 y.o. female presenting on 10/29/2024 for Follow-up (Patient concerned with BP) .  Discussed the use of AI scribe software for clinical note transcription with the patient, who gave verbal consent to proceed.  History of Present Illness       Patient seen in follow-up.  She has a history of hypertension last visit we increased her lisinopril  to 40 mg and started her on chlorthalidone .  Her blood pressure today in office is 125/56.  Notes she is also on doxazosin  2 mg twice daily.  Her home blood pressure log is showing blood pressures in the 120s to 150s systolic with normal diastolic pressures generally.  She is having a little dizziness and lightheadedness in general.  Patient weight elevated with a BMI of 41.5.  She is having difficulty with ambulation due to left knee pain.  Knee pain keeping her awake at nighttime.  Patient has a history of recurrent UTIs.  She also has a history of hysterectomy with bladder prolapse.  She did have a sling placed in the past.  She has had failure of this.  Was noted to have grade 2 prolapse?  History uncertain.  She does not have a gynecologist.  Labs reviewed which are generally unremarkable her cholesterol is in normal range she is not prediabetic despite obesity.     Medical history:  Relevant past medical, surgical, family and social history reviewed and updated as indicated. Interim medical history since our last visit reviewed.  Allergies and medications reviewed and updated.   ROS: Negative unless specifically indicated above in HPI.   Current Medications[1]       Objective:     BP (!) 125/56 (BP Location: Left Arm, Patient Position: Sitting)   Pulse 75   Ht 5' 4 (1.626 m)   Wt 241 lb 12.8 oz (109.7 kg)   SpO2 98%   BMI 41.50 kg/m   Wt Readings from Last 3 Encounters:  10/29/24 241 lb 12.8 oz (109.7 kg)  10/01/24 245 lb 9.6 oz  (111.4 kg)  06/30/24 239 lb 6 oz (108.6 kg)   Recent Results (from the past 2160 hours)  CBC with Differential/Platelet     Status: None   Collection Time: 10/01/24  9:00 AM  Result Value Ref Range   WBC 10.7 3.8 - 10.8 Thousand/uL   RBC 4.76 3.80 - 5.10 Million/uL   Hemoglobin 15.0 11.7 - 15.5 g/dL   HCT 55.4 64.0 - 53.9 %   MCV 93.5 81.4 - 101.7 fL   MCH 31.5 27.0 - 33.0 pg   MCHC 33.7 31.6 - 35.4 g/dL   RDW 88.3 88.9 - 84.9 %   Platelets 295 140 - 400 Thousand/uL   MPV 9.8 7.5 - 12.5 fL   Neutro Abs 6,452 1,500 - 7,800 cells/uL   Absolute Lymphocytes 3,210 850 - 3,900 cells/uL   Absolute Monocytes 888 200 - 950 cells/uL   Eosinophils Absolute 128 15 - 500 cells/uL   Basophils Absolute 21 0 - 200 cells/uL   Neutrophils Relative % 60.3 %   Total Lymphocyte 30.0 %   Monocytes Relative 8.3 %   Eosinophils Relative 1.2 %   Basophils Relative 0.2 %  Comprehensive metabolic panel with GFR     Status: Abnormal   Collection Time: 10/01/24  9:00 AM  Result Value Ref Range   Glucose, Bld  100 (H) 65 - 99 mg/dL    Comment: .            Fasting reference interval . For someone without known diabetes, a glucose value between 100 and 125 mg/dL is consistent with prediabetes and should be confirmed with a follow-up test. .    BUN 13 7 - 25 mg/dL   Creat 9.30 9.49 - 8.94 mg/dL   eGFR 95 > OR = 60 fO/fpw/8.26f7   BUN/Creatinine Ratio SEE NOTE: 6 - 22 (calc)    Comment:    Not Reported: BUN and Creatinine are within    reference range. .    Sodium 138 135 - 146 mmol/L   Potassium 4.0 3.5 - 5.3 mmol/L   Chloride 101 98 - 110 mmol/L   CO2 25 20 - 32 mmol/L   Calcium 9.8 8.6 - 10.4 mg/dL   Total Protein 7.3 6.1 - 8.1 g/dL   Albumin 4.3 3.6 - 5.1 g/dL   Globulin 3.0 1.9 - 3.7 g/dL (calc)   AG Ratio 1.4 1.0 - 2.5 (calc)   Total Bilirubin 0.7 0.2 - 1.2 mg/dL   Alkaline phosphatase (APISO) 87 37 - 153 U/L   AST 17 10 - 35 U/L   ALT 17 6 - 29 U/L  Hemoglobin A1c     Status: None    Collection Time: 10/01/24  9:00 AM  Result Value Ref Range   Hgb A1c MFr Bld 5.1 <5.7 %    Comment: For the purpose of screening for the presence of diabetes: . <5.7%       Consistent with the absence of diabetes 5.7-6.4%    Consistent with increased risk for diabetes             (prediabetes) > or =6.5%  Consistent with diabetes . This assay result is consistent with a decreased risk of diabetes. . Currently, no consensus exists regarding use of hemoglobin A1c for diagnosis of diabetes in children. . According to American Diabetes Association (ADA) guidelines, hemoglobin A1c <7.0% represents optimal control in non-pregnant diabetic patients. Different metrics may apply to specific patient populations.  Standards of Medical Care in Diabetes(ADA). .    Mean Plasma Glucose 100 mg/dL   eAG (mmol/L) 5.5 mmol/L  Lipid panel     Status: None   Collection Time: 10/01/24  9:00 AM  Result Value Ref Range   Cholesterol 146 <200 mg/dL   HDL 63 > OR = 50 mg/dL   Triglycerides 857 <849 mg/dL   LDL Cholesterol (Calc) 60 mg/dL (calc)    Comment: Reference range: <100 . Desirable range <100 mg/dL for primary prevention;   <70 mg/dL for patients with CHD or diabetic patients  with > or = 2 CHD risk factors. SABRA LDL-C is now calculated using the Martin-Hopkins  calculation, which is a validated novel method providing  better accuracy than the Friedewald equation in the  estimation of LDL-C.  Gladis APPLETHWAITE et al. SANDREA. 7986;689(80): 2061-2068  (http://education.[facility].com/faq/FAQ164)    Total CHOL/HDL Ratio 2.3 <5.0 (calc)   Non-HDL Cholesterol (Calc) 83 <869 mg/dL (calc)    Comment: For patients with diabetes plus 1 major ASCVD risk  factor, treating to a non-HDL-C goal of <100 mg/dL  (LDL-C of <29 mg/dL) is considered a therapeutic  option.   Urinalysis, Routine w reflex microscopic     Status: Abnormal   Collection Time: 10/01/24  1:31 PM  Result Value Ref Range    Color, Urine YELLOW YELLOW   APPearance CLEAR  CLEAR   Specific Gravity, Urine 1.006 1.001 - 1.035   pH 7.5 5.0 - 8.0   Glucose, UA NEGATIVE NEGATIVE   Bilirubin Urine NEGATIVE NEGATIVE   Ketones, ur NEGATIVE NEGATIVE   Hgb urine dipstick 1+ (A) NEGATIVE   Protein, ur NEGATIVE NEGATIVE   Nitrite NEGATIVE NEGATIVE   Leukocytes,Ua 1+ (A) NEGATIVE   WBC, UA 0-5 0 - 5 /HPF   RBC / HPF NONE SEEN 0 - 2 /HPF   Squamous Epithelial / HPF NONE SEEN < OR = 5 /HPF   Bacteria, UA NONE SEEN NONE SEEN /HPF   Hyaline Cast NONE SEEN NONE SEEN /LPF  MICROSCOPIC MESSAGE     Status: None   Collection Time: 10/01/24  1:31 PM  Result Value Ref Range   Note      Comment: This urine was analyzed for the presence of WBC,  RBC, bacteria, casts, and other formed elements.  Only those elements seen were reported. . .      Physical Exam  Physical Exam Vitals reviewed.  Constitutional:      Appearance: Normal appearance. Well-developed with normal weight.  Cardiovascular:     Rate and Rhythm: Normal rate and regular rhythm. Normal heart sounds. Normal peripheral pulses Pulmonary:     Normal breath sounds with normal effort Skin:    General: Skin is warm and dry without noticeable rash. Neurological:     General: No focal deficit present.  Psychiatric:        Mood and Affect: Mood, behavior and cognition normal      Assessment & Plan:  No diagnosis found.  No orders of the defined types were placed in this encounter.    Assessment and Plan     #1 morbid obesity.  Her knee pain is significant enough that she needs further evaluation for replacement which has been proposed by orthopedics.  I do strongly recommend this.  Otherwise she will find out mobility continues to decline as she is aging.  We would see an improvement in weight and overall quality of life  2.  Hypertension.  Will have her stop doxazosin .  There is a significant difference between her blood pressure cuff and our office value.   Will have her bring it in in the next 2 weeks to compare.  Keep blood pressure log at home.  Follow-up if lightheadedness or dizziness.  3.  History of recurrent UTIs.  Certainly bladder prolapse can contribute to this.  She may benefit from from vaginal estrogen given her postmenopausal status we will send this to pharmacy.  4.  Bladder prolapse.  Will refer her to urogynecology for further evaluation and management she is not interested in surgical procedures but would like to talk through options going forward.                  [1]  Current Outpatient Medications:    ALPRAZolam  (XANAX ) 0.25 MG tablet, Take 1 tablet (0.25 mg total) by mouth as needed for anxiety (1-2 tabs as needed in before anxiety inducing situation)., Disp: 6 tablet, Rfl: 0   atorvastatin (LIPITOR) 20 MG tablet, Take 20 mg by mouth daily., Disp: , Rfl:    chlorthalidone  (HYGROTON ) 25 MG tablet, Take 1 tablet (25 mg total) by mouth daily., Disp: 90 tablet, Rfl: 1   CRANBERRY PO, Take by mouth daily., Disp: , Rfl:    doxazosin  (CARDURA ) 2 MG tablet, Take 1 tablet (2 mg total) by mouth 2 (two) times daily., Disp: 180  tablet, Rfl: 3   lisinopril  (ZESTRIL ) 40 MG tablet, Take 1 tablet (40 mg total) by mouth daily., Disp: 90 tablet, Rfl: 3   Multiple Vitamin (MULTIVITAMIN ADULT PO), daily., Disp: , Rfl:    Omega-3 Fatty Acids (FISH OIL) 1000 MG CAPS, Take by mouth daily., Disp: , Rfl:    Pseudoephedrine-Acetaminophen (SM NON-ASPRIN SINUS PO), Take 81 mg by mouth daily., Disp: , Rfl:    tirzepatide  (ZEPBOUND ) 2.5 MG/0.5ML injection vial, Inject 2.5 mg into the skin once a week., Disp: 2 mL, Rfl: 2   valACYclovir (VALTREX) 500 MG tablet, Take 500 mg by mouth as needed., Disp: , Rfl:   "

## 2024-10-30 ENCOUNTER — Other Ambulatory Visit (HOSPITAL_COMMUNITY): Payer: Self-pay

## 2024-11-03 NOTE — Telephone Encounter (Signed)
 Pt called and stated that the pharmacy is going to fax over the rejection for this medication. It may need a prior authorization from ins. Please advise pt.

## 2024-11-11 ENCOUNTER — Other Ambulatory Visit: Payer: Self-pay | Admitting: Medical Genetics

## 2024-11-11 ENCOUNTER — Other Ambulatory Visit
Admission: RE | Admit: 2024-11-11 | Discharge: 2024-11-11 | Disposition: A | Discharge status: Home / Self Care | Payer: Self-pay | Source: Ambulatory Visit | Attending: Medical Genetics | Admitting: Medical Genetics

## 2024-11-17 ENCOUNTER — Ambulatory Visit

## 2024-11-18 ENCOUNTER — Telehealth: Payer: Self-pay | Admitting: Pharmacy Technician

## 2024-11-18 ENCOUNTER — Other Ambulatory Visit (HOSPITAL_COMMUNITY): Payer: Self-pay

## 2024-11-18 NOTE — Telephone Encounter (Signed)
 Pharmacy Patient Advocate Encounter   Received notification from [facility] that prior authorization for Zepbound  2.5 mg/0.5 ml inj vial is required/requested.   Insurance verification completed.   The patient is insured through [employer].   Per test claim: Per test claim, medication is not covered due to plan/benefit exclusion, PA not submitted at this time

## 2024-11-21 LAB — GENECONNECT MOLECULAR SCREEN: Genetic Analysis Overall Interpretation: NEGATIVE

## 2024-11-24 ENCOUNTER — Ambulatory Visit

## 2024-11-26 NOTE — Progress Notes (Unsigned)
 "  Subjective:    Patient ID: Julie Church, female    DOB: 04/18/1956, 69 y.o.   MRN: [id number]  HPI   Discussed the use of AI scribe software for clinical note transcription with the patient, who gave verbal consent to proceed.  Julie Church is a 69 year old female with hypertension who presents for a blood pressure follow-up.  She is currently taking chlorthalidone  25 mg daily and lisinopril  40 mg daily for hypertension. Previously, she was on lisinopril  HCT, doxazosin  2 mg twice a day, which was discontinued due to ineffectiveness and causing dizziness. No current dizziness is reported.  She has a history of arthritis and is considering knee replacements. Her BMI needs to be lower to proceed with surgery. She was prescribed Zepbound  for weight management, but her insurance does not cover it, and she cannot afford the out-of-pocket cost. She has tried semaglutide from the internet without success.  She has a history of recurrent shingles, which she associates with a bladder sling that was recalled. She experiences outbreaks approximately every six weeks.      Past Medical History:  Diagnosis Date   Cancer (HCC)    skin   Dilatation of thoracic aorta    Essential hypertension     Current Outpatient Medications  Medication Sig Dispense Refill   ALPRAZolam  (XANAX ) 0.25 MG tablet Take 1 tablet (0.25 mg total) by mouth as needed for anxiety (1-2 tabs as needed in before anxiety inducing situation). 6 tablet 0   atorvastatin (LIPITOR) 20 MG tablet Take 20 mg by mouth daily.     chlorthalidone  (HYGROTON ) 25 MG tablet Take 1 tablet (25 mg total) by mouth daily. 90 tablet 1   CRANBERRY PO Take by mouth daily.     Estradiol  10 MCG TABS vaginal tablet Place 1 tablet (10 mcg total) vaginally daily. Once daily for two weeks followed by twice weekly 90 tablet 1   lisinopril  (ZESTRIL ) 40 MG tablet Take 1 tablet (40 mg total) by mouth daily. 90 tablet 3   Multiple Vitamin  (MULTIVITAMIN ADULT PO) daily.     Omega-3 Fatty Acids (FISH OIL) 1000 MG CAPS Take by mouth daily.     Pseudoephedrine-Acetaminophen (SM NON-ASPRIN SINUS PO) Take 81 mg by mouth daily.     tirzepatide  (ZEPBOUND ) 2.5 MG/0.5ML injection vial Inject 2.5 mg into the skin once a week. 2 mL 2   valACYclovir  (VALTREX ) 500 MG tablet Take 500 mg by mouth as needed.     No current facility-administered medications for this visit.    Allergies[1]  Family History  Problem Relation Age of Onset   Hyperlipidemia Mother    Hypertension Mother    Hyperlipidemia Father    Hypertension Father    Breast cancer Neg Hx     Social History   Socioeconomic History   Marital status: Married    Spouse name: Not on file   Number of children: Not on file   Years of education: Not on file   Highest education level: Not on file  Occupational History   Not on file  Tobacco Use   Smoking status: Never   Smokeless tobacco: Never  Vaping Use   Vaping status: Never Used  Substance and Sexual Activity   Alcohol use: Not Currently   Drug use: Never   Sexual activity: Not on file  Other Topics Concern   Not on file  Social History Narrative   Not on file   Social Drivers of Health  Tobacco Use: Low Risk (10/29/2024)   Patient History    Smoking Tobacco Use: Never    Smokeless Tobacco Use: Never    Passive Exposure: Not on file  Financial Resource Strain: Low Risk (12/06/2023)   Received from [facility]   Overall Financial Resource Strain (CARDIA)    Difficulty of Paying Living Expenses: Not very hard  Food Insecurity: No Food Insecurity (12/06/2023)   Received from [facility]   Epic    Within the past 12 months, you worried that your food would run out before you got the money to buy more.: Never true    Within the past 12 months, the food you bought just didn't last and you didn't have money to get more.: Never true  Transportation Needs: No Transportation Needs (12/06/2023)   Received  from [facility]   PRAPARE - Transportation    Lack of Transportation (Medical): No    Lack of Transportation (Non-Medical): No  Physical Activity: Not on file  Stress: Not on file  Social Connections: Unknown (04/29/2023)   Received from [facility]   Social Network    Social Network: Not on file  Intimate Partner Violence: Not At Risk (12/06/2023)   Received from [facility]   Epic    Within the last year, have you been afraid of your partner or ex-partner?: No    Within the last year, have you been humiliated or emotionally abused in other ways by your partner or ex-partner?: No    Within the last year, have you been kicked, hit, slapped, or otherwise physically hurt by your partner or ex-partner?: No    Within the last year, have you been raped or forced to have any kind of sexual activity by your partner or ex-partner?: No  Depression (PHQ2-9): Not on file  Alcohol Screen: Not on file  Housing: Not on file  Utilities: Low Risk (12/06/2023)   Received from [facility]   Utilities    Within the past 12 months, have you been unable to get utilities(heat, electricity) when it was really needed?: No  Health Literacy: Not on file     Constitutional: Denies fever, malaise, fatigue, headache or abrupt weight changes.  HEENT: Denies eye pain, eye redness, ear pain, ringing in the ears, wax buildup, runny nose, nasal congestion, bloody nose, or sore throat. Respiratory: Denies difficulty breathing, shortness of breath, cough or sputum production.   Cardiovascular: Denies chest pain, chest tightness, palpitations or swelling in the hands or feet.  Gastrointestinal: Denies abdominal pain, bloating, constipation, diarrhea or blood in the stool.  GU: Denies urgency, frequency, pain with urination, burning sensation, blood in urine, odor or discharge. Musculoskeletal: Patient reports bilateral knee pain.  Denies decrease in range of motion, difficulty with gait, muscle pain or joint  swelling.  Skin: Patient reports recurrent shingles.  Denies redness, rashes, lesions or ulcercations.  Neurological: Denies dizziness, difficulty with memory, difficulty with speech or problems with balance and coordination.  Psych: Denies anxiety, depression, SI/HI.  No other specific complaints in a complete review of systems (except as listed in HPI above).  Objective   BP 130/80 (BP Location: Left Arm, Patient Position: Sitting, Cuff Size: Large)   Ht 5' 4 (1.626 m)   Wt 240 lb (108.9 kg)   BMI 41.20 kg/m   Wt Readings from Last 3 Encounters:  10/29/24 241 lb 12.8 oz (109.7 kg)  10/01/24 245 lb 9.6 oz (111.4 kg)  06/30/24 239 lb 6  oz (108.6 kg)    General: Appears her stated age, obese, in NAD. HEENT: Head: normal shape and size; Eyes: sclera white, no icterus, conjunctiva pink, PERRLA and EOMs intact;  Cardiovascular: Normal rate and rhythm. S1,S2 noted.  No murmur, rubs or gallops noted. No JVD or BLE edema. Pulmonary/Chest: Normal effort and positive vesicular breath sounds. No respiratory distress. No wheezes, rales or ronchi noted.  Musculoskeletal: Joint enlargement noted of bilateral knees.  No difficulty with gait.  Neurological: Alert and oriented. Cranial nerves II-XII grossly intact. Coordination normal.    BMET    Component Value Date/Time   NA 138 10/01/2024 0900   K 4.0 10/01/2024 0900   CL 101 10/01/2024 0900   CO2 25 10/01/2024 0900   GLUCOSE 100 (H) 10/01/2024 0900   BUN 13 10/01/2024 0900   CREATININE 0.69 10/01/2024 0900   CALCIUM 9.8 10/01/2024 0900    Lipid Panel     Component Value Date/Time   CHOL 146 10/01/2024 0900   TRIG 142 10/01/2024 0900   HDL 63 10/01/2024 0900   CHOLHDL 2.3 10/01/2024 0900   LDLCALC 60 10/01/2024 0900    CBC    Component Value Date/Time   WBC 10.7 10/01/2024 0900   RBC 4.76 10/01/2024 0900   HGB 15.0 10/01/2024 0900   HCT 44.5 10/01/2024 0900   PLT 295 10/01/2024 0900   MCV 93.5 10/01/2024 0900   MCH  31.5 10/01/2024 0900   MCHC 33.7 10/01/2024 0900   RDW 11.6 10/01/2024 0900   EOSABS 128 10/01/2024 0900   BASOSABS 21 10/01/2024 0900    Hgb A1C Lab Results  Component Value Date   HGBA1C 5.1 10/01/2024       Assessment and Plan   Assessment and Plan    Primary hypertension Blood pressure controlled at 130/80 mmHg with current medication. Kidney function and potassium levels normal. - Continue chlorthalidone  25 mg daily. - Continue lisinopril  40 mg daily. - Reinforced DASH diet and exercise for weight loss  Morbid obesity due to excess calories High BMI affects joint replacement eligibility. Insurance does not cover Zepbound . Oral Wegovy may require higher doses. Zepbound  more effective due to dual receptor action. - Consider oral Wegovy if affordable, she declines at this time.   Recurrent herpes zoster with complication - Continue valacyclovir  500 mg daily  Bilateral primary osteoarthritis of knee Severe knee pain affects mobility. Joint replacement contingent on BMI reduction. Financial constraints limit weight management options. - Encourage weight loss as this can help reduce joint pain    RTC in 6 months for your annual exam  Angeline Laura, NP     [1] No Known Allergies  "

## 2024-11-27 ENCOUNTER — Telehealth: Payer: Self-pay

## 2024-11-27 ENCOUNTER — Encounter: Payer: Self-pay | Admitting: Internal Medicine

## 2024-11-27 ENCOUNTER — Ambulatory Visit: Admitting: Internal Medicine

## 2024-11-27 VITALS — BP 130/80 | Ht 64.0 in | Wt 240.0 lb

## 2024-11-27 DIAGNOSIS — B029 Zoster without complications: Secondary | ICD-10-CM | POA: Insufficient documentation

## 2024-11-27 DIAGNOSIS — B028 Zoster with other complications: Secondary | ICD-10-CM

## 2024-11-27 DIAGNOSIS — I10 Essential (primary) hypertension: Secondary | ICD-10-CM

## 2024-11-27 MED ORDER — VALACYCLOVIR HCL 500 MG PO TABS: 500.0000 mg | ORAL_TABLET | ORAL | 1 refills | Status: DC | PRN

## 2024-11-27 MED ORDER — VALACYCLOVIR HCL 500 MG PO TABS: 500.0000 mg | ORAL_TABLET | Freq: Every day | ORAL | 1 refills | Status: AC

## 2024-11-27 NOTE — Telephone Encounter (Signed)
 Received fax from [facility], how many times a day does she need to take valtrex ?

## 2024-11-27 NOTE — Patient Instructions (Signed)
 DASH (Dietary Approaches to Stop Hypertension): Eating Plan  DASH stands for Dietary Approaches to Stop Hypertension. The DASH eating plan is a healthy eating plan that has been shown to: Lower high blood pressure (hypertension). Reduce your risk for type 2 diabetes, heart disease, and stroke. Help with weight loss. What are tips for following this plan? Reading food labels Check food labels for the amount of salt (sodium) per serving. Choose foods with less than 5 percent of the Daily Value (DV) of sodium. In general, foods with less than 300 milligrams (mg) of sodium per serving fit into this eating plan. To find whole grains, look for the word whole as the first word in the ingredient list. Shopping Buy products labeled as low-sodium or no salt added. Buy fresh foods. Avoid canned foods and pre-made or frozen meals. Cooking Try not to add salt when you cook. Use salt-free seasonings or herbs instead of table salt or sea salt. Check with your health care provider or pharmacist before using salt substitutes. Do not fry foods. Cook foods in healthy ways, such as baking, boiling, grilling, roasting, or broiling. Cook using oils that are good for your heart. These include olive, canola, avocado, soybean, and sunflower oil. Meal planning  Eat a balanced diet. This should include: 4 or more servings of fruits and 4 or more servings of vegetables each day. Try to fill half of your plate with fruits and vegetables. 6-8 servings of whole grains each day. 6 or less servings of lean meat, poultry, or fish each day. 1 oz is 1 serving. A 3 oz (85 g) serving of meat is about the same size as the palm of your hand. One egg is 1 oz (28 g). 2-3 servings of low-fat dairy each day. One serving is 1 cup (237 mL). 1 serving of nuts, seeds, or beans 5 times each week. 2-3 servings of heart-healthy fats. Healthy fats called omega-3 fatty acids are found in foods such as walnuts, flaxseeds, fortified milks,  and eggs. These fats are also found in cold-water fish, such as sardines, salmon, and mackerel. Limit how much you eat of: Canned or prepackaged foods. Food that is high in trans fat, such as fried foods. Food that is high in saturated fat, such as fatty meat. Desserts and other sweets, sugary drinks, and other foods with added sugar. Full-fat dairy products. Do not salt foods before eating. Do not eat more than 4 egg yolks a week. Try to eat at least 2 vegetarian meals a week. Eat more home-cooked food and less restaurant, buffet, and fast food. Lifestyle When eating at a restaurant, ask if your food can be made with less salt or no salt. If you drink alcohol: Limit how much you have to: 0-1 drink a day if you are female. 0-2 drinks a day if you are female. Know how much alcohol is in your drink. In the [place] one drink is one 12 oz bottle of beer (355 mL), one 5 oz glass of wine (148 mL), or one 1 oz glass of hard liquor (44 mL). General information Avoid eating more than 2,300 mg of salt a day. If you have hypertension, you may need to reduce your sodium intake to 1,500 mg a day. Work with your provider to stay at a healthy body weight or lose weight. Ask what the best weight range is for you. On most days of the week, get at least 30 minutes of exercise that causes your heart to beat faster.  This may include walking, swimming, or biking. Work with your provider or dietitian to adjust your eating plan to meet your specific calorie needs. What foods should I eat? Fruits All fresh, dried, or frozen fruit. Canned fruits that are in their natural juice and do not have sugar added to them. Vegetables Fresh or frozen vegetables that are raw, steamed, roasted, or grilled. Low-sodium or reduced-sodium tomato and vegetable juice. Low-sodium or reduced-sodium tomato sauce and tomato paste. Low-sodium or reduced-sodium canned vegetables. Grains Whole-grain or whole-wheat bread. Whole-grain or  whole-wheat pasta. Brown rice. [place]il Madeira. Bulgur. Whole-grain and low-sodium cereals. Pita bread. Low-fat, low-sodium crackers. Whole-wheat flour tortillas. Meats and other proteins Skinless chicken or turkey. Ground chicken or turkey. Pork with fat trimmed off. Fish and seafood. Egg whites. Dried beans, peas, or lentils. Unsalted nuts, nut butters, and seeds. Unsalted canned beans. Lean cuts of beef with fat trimmed off. Low-sodium, lean precooked or cured meat, such as sausages or meat loaves. Dairy Low-fat (1%) or fat-free (skim) milk. Reduced-fat, low-fat, or fat-free cheeses. Nonfat, low-sodium ricotta or cottage cheese. Low-fat or nonfat yogurt. Low-fat, low-sodium cheese. Fats and oils Soft margarine without trans fats. Vegetable oil. Reduced-fat, low-fat, or light mayonnaise and salad dressings (reduced-sodium). Canola, safflower, olive, avocado, soybean, and sunflower oils. Avocado. Seasonings and condiments Herbs. Spices. Seasoning mixes without salt. Other foods Unsalted popcorn and pretzels. Fat-free sweets. The items listed above may not be all the foods and drinks you can have. Talk to a dietitian to learn more. What foods should I avoid? Fruits Canned fruit in a light or heavy syrup. Fried fruit. Fruit in cream or butter sauce. Vegetables Creamed or fried vegetables. Vegetables in a cheese sauce. Regular canned vegetables that are not marked as low-sodium or reduced-sodium. Regular canned tomato sauce and paste that are not marked as low-sodium or reduced-sodium. Regular tomato and vegetable juices that are not marked as low-sodium or reduced-sodium. Dene. Olives. Grains Baked goods made with fat, such as croissants, muffins, or some breads. Dry pasta or rice meal packs. Meats and other proteins Fatty cuts of meat. Ribs. Fried meat. [place]. Bologna, salami, and other precooked or cured meats, such as sausages or meat loaves, that are not lean and low in sodium. Fat from the  back of a pig (fatback). Bratwurst. Salted nuts and seeds. Canned beans with added salt. Canned or smoked fish. Whole eggs or egg yolks. Chicken or turkey with skin. Dairy Whole or 2% milk, cream, and half-and-half. Whole or full-fat cream cheese. Whole-fat or sweetened yogurt. Full-fat cheese. Nondairy creamers. Whipped toppings. Processed cheese and cheese spreads. Fats and oils Butter. Stick margarine. Lard. Shortening. Ghee. Bacon fat. Tropical oils, such as coconut, palm kernel, or palm oil. Seasonings and condiments Onion salt, garlic salt, seasoned salt, table salt, and sea salt. Worcestershire sauce. Tartar sauce. Barbecue sauce. Teriyaki sauce. Soy sauce, including reduced-sodium soy sauce. Steak sauce. Canned and packaged gravies. Fish sauce. Oyster sauce. Cocktail sauce. Store-bought horseradish. Ketchup. Mustard. Meat flavorings and tenderizers. Bouillon cubes. Hot sauces. Pre-made or packaged marinades. Pre-made or packaged taco seasonings. Relishes. Regular salad dressings. Other foods Salted popcorn and pretzels. The items listed above may not be all the foods and drinks you should avoid. Talk to a dietitian to learn more. Where to find more information National Heart, Lung, and Blood Institute (NHLBI): buffalodrycleaner.gl American Heart Association (AHA): heart.org Academy of Nutrition and Dietetics: eatright.org National Kidney Foundation (NKF): kidney.org This information is not intended to replace advice given to you by  your health care provider. Make sure you discuss any questions you have with your health care provider. Document Revised: 08/15/2024 Document Reviewed: 10/26/2022 Elsevier Patient Education  2025 [employer].

## 2024-11-27 NOTE — Addendum Note (Signed)
 Addended by: ZELIA GAUZE D on: 11/27/2024 10:23 AM   Modules accepted: Orders

## 2024-11-27 NOTE — Telephone Encounter (Signed)
daily

## 2024-12-24 ENCOUNTER — Ambulatory Visit

## 2025-05-28 ENCOUNTER — Encounter: Admitting: Internal Medicine
